# Patient Record
Sex: Female | Born: 1935 | Race: Black or African American | Hispanic: No | Smoking: Never smoker
Health system: Southern US, Community
[De-identification: ages and names within clinical notes are randomized; demographics above are authoritative.]

## PROBLEM LIST (undated history)

## (undated) ENCOUNTER — Emergency Department (HOSPITAL_COMMUNITY): Admission: EM | Payer: Self-pay | Source: Home / Self Care

## (undated) DIAGNOSIS — H409 Unspecified glaucoma: Secondary | ICD-10-CM

## (undated) DIAGNOSIS — I10 Essential (primary) hypertension: Secondary | ICD-10-CM

## (undated) DIAGNOSIS — K219 Gastro-esophageal reflux disease without esophagitis: Secondary | ICD-10-CM

## (undated) DIAGNOSIS — R300 Dysuria: Secondary | ICD-10-CM

## (undated) DIAGNOSIS — R319 Hematuria, unspecified: Secondary | ICD-10-CM

## (undated) DIAGNOSIS — I2510 Atherosclerotic heart disease of native coronary artery without angina pectoris: Secondary | ICD-10-CM

## (undated) DIAGNOSIS — M1990 Unspecified osteoarthritis, unspecified site: Secondary | ICD-10-CM

## (undated) DIAGNOSIS — J449 Chronic obstructive pulmonary disease, unspecified: Secondary | ICD-10-CM

## (undated) DIAGNOSIS — B369 Superficial mycosis, unspecified: Secondary | ICD-10-CM

## (undated) HISTORY — DX: Unspecified osteoarthritis, unspecified site: M19.90

## (undated) HISTORY — DX: Dysuria: R30.0

## (undated) HISTORY — PX: CHOLECYSTECTOMY: SHX55

## (undated) HISTORY — PX: ABDOMINAL HYSTERECTOMY: SHX81

## (undated) HISTORY — DX: Hematuria, unspecified: R31.9

## (undated) HISTORY — DX: Superficial mycosis, unspecified: B36.9

## (undated) HISTORY — PX: APPENDECTOMY: SHX54

---

## 2000-02-27 ENCOUNTER — Encounter (HOSPITAL_BASED_OUTPATIENT_CLINIC_OR_DEPARTMENT_OTHER): Payer: Self-pay | Admitting: Internal Medicine

## 2000-02-27 ENCOUNTER — Inpatient Hospital Stay (HOSPITAL_COMMUNITY): Admission: RE | Admit: 2000-02-27 | Discharge: 2000-02-29 | Payer: Self-pay | Admitting: Internal Medicine

## 2001-02-15 ENCOUNTER — Other Ambulatory Visit: Admission: RE | Admit: 2001-02-15 | Discharge: 2001-02-15 | Payer: Self-pay | Admitting: Obstetrics and Gynecology

## 2001-08-26 ENCOUNTER — Emergency Department (HOSPITAL_COMMUNITY): Admission: EM | Admit: 2001-08-26 | Discharge: 2001-08-26 | Payer: Self-pay | Admitting: Emergency Medicine

## 2001-08-26 ENCOUNTER — Encounter: Payer: Self-pay | Admitting: Emergency Medicine

## 2002-03-08 ENCOUNTER — Encounter: Payer: Self-pay | Admitting: Emergency Medicine

## 2002-03-08 ENCOUNTER — Emergency Department (HOSPITAL_COMMUNITY): Admission: EM | Admit: 2002-03-08 | Discharge: 2002-03-08 | Payer: Self-pay | Admitting: Emergency Medicine

## 2002-03-11 ENCOUNTER — Encounter: Payer: Self-pay | Admitting: Family Medicine

## 2002-03-11 ENCOUNTER — Ambulatory Visit (HOSPITAL_COMMUNITY): Admission: RE | Admit: 2002-03-11 | Discharge: 2002-03-11 | Payer: Self-pay | Admitting: Family Medicine

## 2002-08-04 ENCOUNTER — Encounter: Payer: Self-pay | Admitting: Emergency Medicine

## 2002-08-04 ENCOUNTER — Emergency Department (HOSPITAL_COMMUNITY): Admission: EM | Admit: 2002-08-04 | Discharge: 2002-08-04 | Payer: Self-pay | Admitting: *Deleted

## 2002-11-04 ENCOUNTER — Encounter: Payer: Self-pay | Admitting: Orthopedic Surgery

## 2002-11-04 ENCOUNTER — Ambulatory Visit: Admission: RE | Admit: 2002-11-04 | Discharge: 2002-11-04 | Payer: Self-pay | Admitting: Orthopedic Surgery

## 2003-01-21 ENCOUNTER — Ambulatory Visit (HOSPITAL_COMMUNITY): Admission: RE | Admit: 2003-01-21 | Discharge: 2003-01-21 | Payer: Self-pay | Admitting: Family Medicine

## 2003-02-26 ENCOUNTER — Emergency Department (HOSPITAL_COMMUNITY): Admission: EM | Admit: 2003-02-26 | Discharge: 2003-02-26 | Payer: Self-pay | Admitting: Emergency Medicine

## 2003-10-01 ENCOUNTER — Emergency Department (HOSPITAL_COMMUNITY): Admission: EM | Admit: 2003-10-01 | Discharge: 2003-10-01 | Payer: Self-pay | Admitting: Emergency Medicine

## 2003-11-20 ENCOUNTER — Inpatient Hospital Stay (HOSPITAL_COMMUNITY): Admission: EM | Admit: 2003-11-20 | Discharge: 2003-11-22 | Payer: Self-pay | Admitting: Emergency Medicine

## 2004-02-02 ENCOUNTER — Encounter (HOSPITAL_COMMUNITY): Admission: RE | Admit: 2004-02-02 | Discharge: 2004-03-03 | Payer: Self-pay | Admitting: Family Medicine

## 2004-06-13 ENCOUNTER — Ambulatory Visit (HOSPITAL_COMMUNITY): Admission: RE | Admit: 2004-06-13 | Discharge: 2004-06-13 | Payer: Self-pay | Admitting: Family Medicine

## 2004-10-20 ENCOUNTER — Emergency Department (HOSPITAL_COMMUNITY): Admission: EM | Admit: 2004-10-20 | Discharge: 2004-10-20 | Payer: Self-pay | Admitting: Emergency Medicine

## 2004-10-24 ENCOUNTER — Ambulatory Visit (HOSPITAL_COMMUNITY): Admission: RE | Admit: 2004-10-24 | Discharge: 2004-10-24 | Payer: Self-pay | Admitting: Family Medicine

## 2005-04-23 ENCOUNTER — Emergency Department (HOSPITAL_COMMUNITY): Admission: EM | Admit: 2005-04-23 | Discharge: 2005-04-23 | Payer: Self-pay | Admitting: Emergency Medicine

## 2005-11-23 ENCOUNTER — Encounter (INDEPENDENT_AMBULATORY_CARE_PROVIDER_SITE_OTHER): Payer: Self-pay | Admitting: Specialist

## 2005-11-23 ENCOUNTER — Ambulatory Visit (HOSPITAL_COMMUNITY): Admission: RE | Admit: 2005-11-23 | Discharge: 2005-11-23 | Payer: Self-pay | Admitting: Gastroenterology

## 2005-11-23 ENCOUNTER — Ambulatory Visit: Payer: Self-pay | Admitting: Gastroenterology

## 2005-12-26 ENCOUNTER — Ambulatory Visit (HOSPITAL_COMMUNITY): Admission: RE | Admit: 2005-12-26 | Discharge: 2005-12-26 | Payer: Self-pay | Admitting: Family Medicine

## 2006-02-14 IMAGING — CR DG CHEST 1V PORT
1 series · 1 of 1 positions shown · non-contrast
Comparison: Two view chest x-ray 10/01/03.

CLINICAL DATA: Chest pain.  
 PORTABLE CHEST ONE VIEW 11/20/03

[view not recorded]
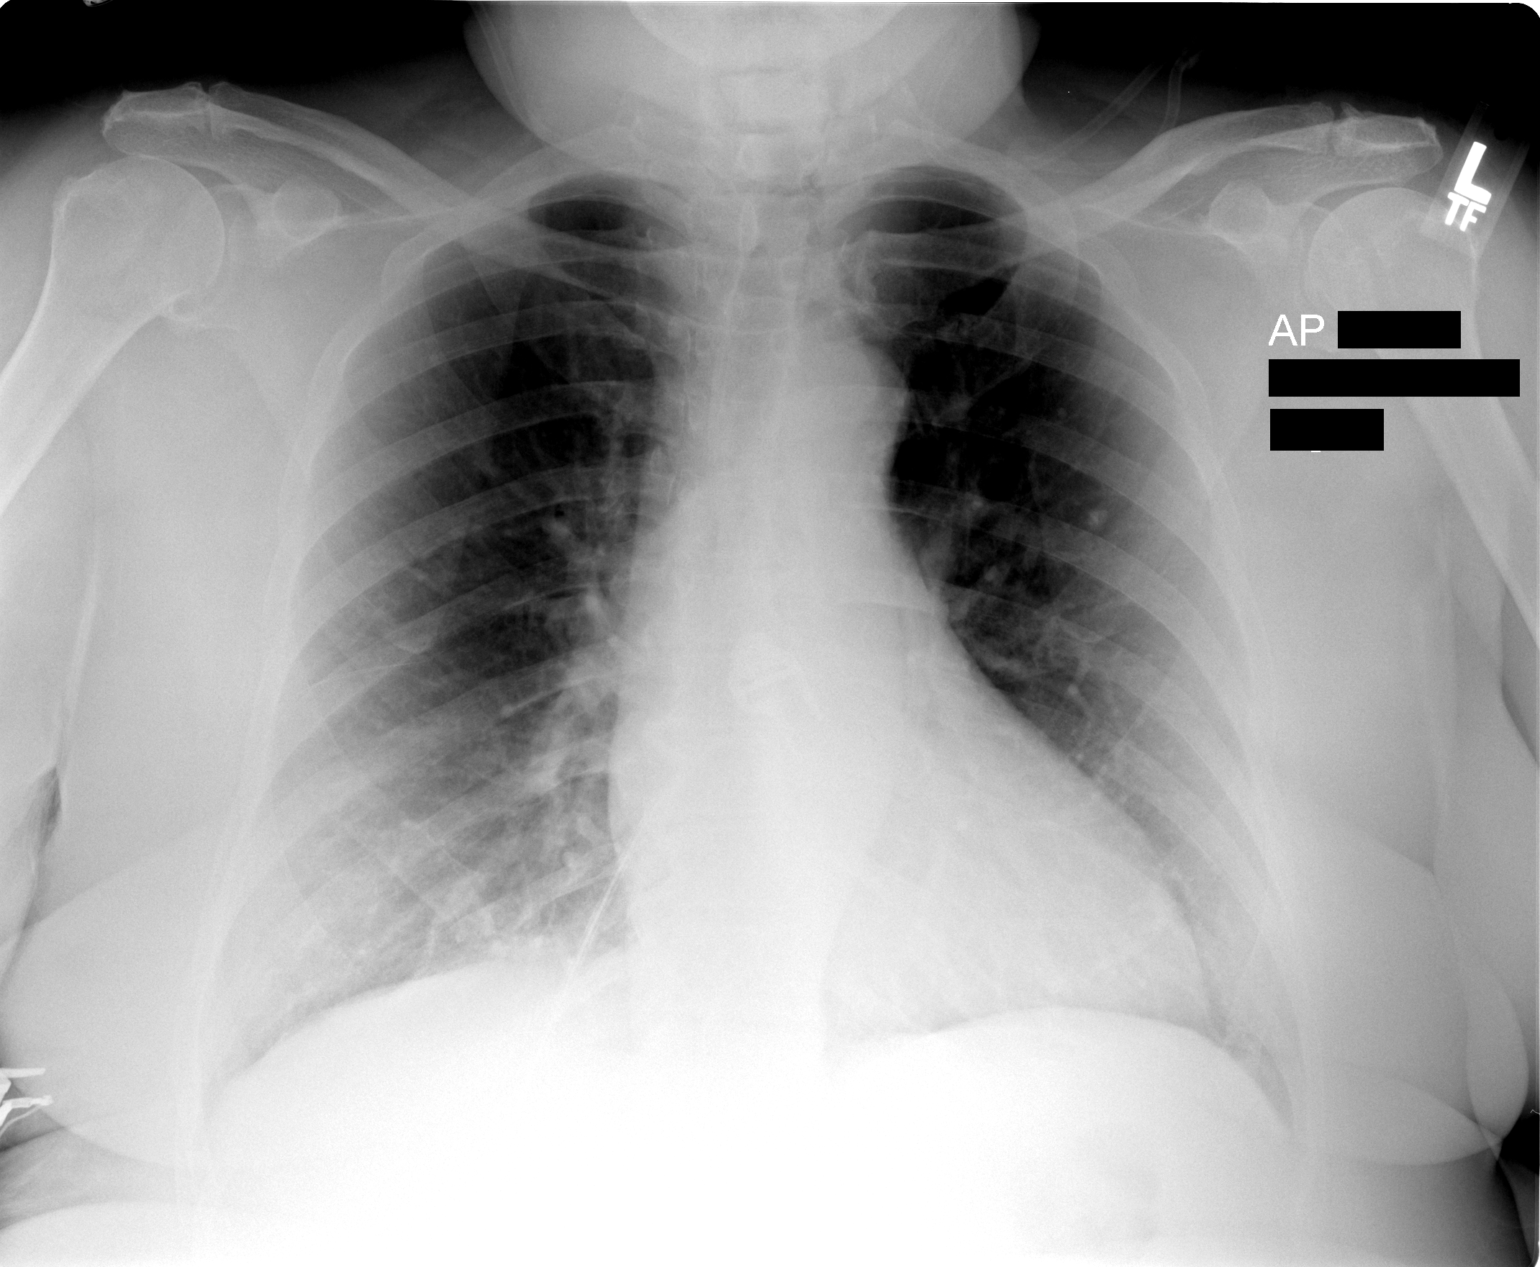

[1 of 1 positions shown; findings below may reference images not displayed]

FINDINGS: The heart size is normal and is stable.  The thoracic aorta is mildly tortuous but unchanged.  The lungs appear clear.  
 IMPRESSION
 No evidence of acute disease.

## 2006-03-09 ENCOUNTER — Emergency Department (HOSPITAL_COMMUNITY): Admission: EM | Admit: 2006-03-09 | Discharge: 2006-03-09 | Payer: Self-pay | Admitting: Emergency Medicine

## 2007-01-18 ENCOUNTER — Ambulatory Visit (HOSPITAL_COMMUNITY): Admission: RE | Admit: 2007-01-18 | Discharge: 2007-01-18 | Payer: Self-pay | Admitting: Internal Medicine

## 2007-06-19 ENCOUNTER — Ambulatory Visit (HOSPITAL_COMMUNITY): Admission: RE | Admit: 2007-06-19 | Discharge: 2007-06-19 | Payer: Self-pay | Admitting: Internal Medicine

## 2007-08-16 ENCOUNTER — Emergency Department (HOSPITAL_COMMUNITY): Admission: EM | Admit: 2007-08-16 | Discharge: 2007-08-16 | Payer: Self-pay | Admitting: Emergency Medicine

## 2007-10-19 ENCOUNTER — Emergency Department (HOSPITAL_COMMUNITY): Admission: EM | Admit: 2007-10-19 | Discharge: 2007-10-19 | Payer: Self-pay | Admitting: Emergency Medicine

## 2007-11-27 ENCOUNTER — Encounter (INDEPENDENT_AMBULATORY_CARE_PROVIDER_SITE_OTHER): Payer: Self-pay | Admitting: Internal Medicine

## 2007-11-27 ENCOUNTER — Ambulatory Visit: Payer: Self-pay | Admitting: Cardiology

## 2007-11-27 ENCOUNTER — Ambulatory Visit (HOSPITAL_COMMUNITY): Admission: RE | Admit: 2007-11-27 | Discharge: 2007-11-27 | Payer: Self-pay | Admitting: Internal Medicine

## 2007-12-13 ENCOUNTER — Ambulatory Visit: Payer: Self-pay | Admitting: Cardiology

## 2008-08-06 ENCOUNTER — Emergency Department (HOSPITAL_COMMUNITY): Admission: EM | Admit: 2008-08-06 | Discharge: 2008-08-06 | Payer: Self-pay | Admitting: Emergency Medicine

## 2008-09-05 ENCOUNTER — Emergency Department (HOSPITAL_COMMUNITY): Admission: EM | Admit: 2008-09-05 | Discharge: 2008-09-05 | Payer: Self-pay | Admitting: Emergency Medicine

## 2009-07-21 ENCOUNTER — Emergency Department (HOSPITAL_COMMUNITY): Admission: EM | Admit: 2009-07-21 | Discharge: 2009-07-21 | Payer: Self-pay | Admitting: Emergency Medicine

## 2010-03-05 ENCOUNTER — Emergency Department (HOSPITAL_COMMUNITY)
Admission: EM | Admit: 2010-03-05 | Discharge: 2010-03-05 | Payer: Self-pay | Source: Home / Self Care | Admitting: Emergency Medicine

## 2010-03-20 ENCOUNTER — Encounter: Payer: Self-pay | Admitting: Family Medicine

## 2010-07-12 NOTE — Assessment & Plan Note (Signed)
[facility]                       [facility] OFFICE NOTE   NAME:Solis, Roberta D                      MRN:          [id number]  DATE:12/13/2007                            DOB:          1931/10/06    REFERRING PHYSICIAN:  Tesfaye D. Felecia Shelling, MD   REASON FOR VISIT:  Abnormal echocardiogram and lower extremity edema.   HISTORY OF PRESENT ILLNESS:  [name] is a very pleasant 76-year-  old woman with a history of type 2 diabetes mellitus with some degree of  reported neuropathy, hypertension, obesity, and previous reassuring  cardiac catheterization back in 2002 demonstrating minor atherosclerosis  with no flow-limiting lesions.  She reports a 62-month history of lower  extremity edema, right somewhat worse than left without any obvious  inciting injury.  She has not had any significant change in her baseline  breathing status nor any chest discomfort associated with this.  She  reports no PND or orthopnea.  She describes this edema is being fairly  dependent, worse at the end of the today when she has been standing, and  gone in the morning when she gets up.  She uses Lasix intermittently and  also lower extremity compression hose intermittently with some  improvement.  She reports that she has had approximately 14-pound weight  loss with focus on diet over the last 3 weeks and is very pleased with  this.  In fact, her symptoms are improving with this intervention.  Her  electrocardiogram shows sinus rhythm at 77 beats per minute with  premature atrial complexes, left ventricle hypertrophy, and left atrial  enlargement.  She had lower extremity arterial studies done back in  April which demonstrated no obvious obstructive disease and more  recently an echocardiogram done in September demonstrating vigorous left  ventricular systolic function at 65-70% without wall motion  abnormalities and with mild diastolic dysfunction.  She had moderate  left ventricular hypertrophy noted associated with mild-to-moderate left  atrial enlargement and trace mitral regurgitation.  Incidentally noted  was borderline aneurysmal motion of the inner atrial septum with  inability to completely exclude a patent foramen ovale, although no  obvious large degree of shunting was noted.  She comes into the office  to discuss this as well.   ALLERGIES:  No known drug allergies.   Present medications include:  1. Glyburide 3 mg p.o. b.i.d.  2. Gabapentin 300 mg p.o. nightly  3. Lisinopril 20 mg p.o. daily.  4. Verapamil 120 mg p.o. daily.  5. Lasix p.r.n. (not clear on dose.)   PAST MEDICAL HISTORY:  As outlined above.  She has reflux and uses an  over-the-counter antacid.  Also, reports previous hysterectomy and  cholecystectomy.   SOCIAL HISTORY:  She is retired, has 2 sons.  She does exercise using a  stationary bicycle in the morning without any particular symptoms.  Denies any active tobacco use, although did smoke approximately 30 years  ago.  No alcohol use.   Family history was reviewed, significant for massive heart attack in  one of her sisters as well as stroke and diabetes mellitus.  Her mother  notably lived to 73 years of age.   REVIEW OF SYSTEMS:  As outlined above.  She feels as if her breathing is  actually fairly good describing NYHA class II symptoms.  She is not  having any orthopnea, PND, or palpitations.  She has problems with  osteoarthritis and does ambulate with a cane.  No recent falls.  Otherwise, negative.   PHYSICAL EXAMINATION:  VITAL SIGNS:  Blood pressure is 124/71, heart  rate is 76, weight is 247 pounds, height is 5 feet 3 inches.  GENERAL:  This is an obese woman no acute distress.  HEENT:  Conjunctiva is normal.  Pharynx clear.  NECK:  Supple.  No elevated jugular venous pressure is obvious with  increased neck girth.  No thyromegaly.  LUNGS:  Exhibit diminished breath sounds throughout.  CARDIAC:  A  regular rate and rhythm.  No pathologic murmur or S3 gallop.  Second heart sound is normal.  ABDOMEN:  Obese.  EXTREMITIES:  Exhibit increased adipose tissue throughout.  There is  edema noted that is essentially symmetric below the knees bilaterally  and 1+ pitting.  Distal pulses are 1+.  SKIN:  Warm and dry.  MUSCULOSKELETAL:  No kyphosis noted.  NEUROPSYCHIATRIC:  The patient is alert x3.  Affect is normal.   IMPRESSION AND RECOMMENDATIONS:  1. Lower extremity edema, likely complicated by obesity, and some      degree of venous insufficiency as well as effects of neuropathy      overtime.  Her left ventricular systolic function is vigorous, and      she does have some diastolic dysfunction associated with moderate      left ventricular hypertrophy, likely a reflection of hypertension      over the years.  She had no clear description of increased right-      sided pressures with only trivial tricuspid regurgitation and      overall normal right ventricular systolic function.  I think that      her present management with diuretic therapy and compression hose      is very reasonable and doubt that further cardiac workup is      necessary at this time.  She has had lower extremity arterial      studies done earlier in the year.  If her edema becomes truly      asymmetric or progresses, one could consider venous Dopplers to      exclude thromboembolic disease, but this seems unlikely as well at      this point.  2. Incidentally noted aneurysmal motion of the interatrial septum with      inability to completely exclude a patent foramen ovale, although no      large shunt was described.  I suspect that this is clinically      noncontributory at this point.  If further investigation is wanted,      would suggest an echocardiogram with agitated saline injection for      better visualization of this area.  She does not require a      transesophageal echocardiogram at this point.  3. In  terms of cardiac disposition, we will see her back as needed,      particularly if the symptoms progress.  Otherwise, regular follow      up will be with Dr. Felecia Shelling.     Jonelle Sidle, MD  Electronically Signed    SGM/MedQ  DD: 12/13/2007  DT: 12/14/2007  Job #: [id number]  cc:   Tesfaye D. Felecia Shelling, MD

## 2010-07-15 NOTE — Cardiovascular Report (Signed)
[name]  Patient:    [name], [name]                        MRN: [id number] Proc. Date: 02/29/00 Adm. Date:  [id number] Attending:  Lewayne Bunting CC:         Loran Senters, M.D.  Lewayne Bunting, M.D.  [facility]   Cardiac Catheterization  PROCEDURE PERFORMED:  Left heart catheterization with coronary angiography and left ventriculography.  INDICATION:  Ms. Hinojosa is a 77 year old woman with cardiovascular risk factors.  She has been having progressive exertional chest pain and was referred for cardiac catheterization.  PROCEDURAL NOTE:  A 6-French sheath was placed in the right femoral artery. Standard Judkins 6-French catheters were utilized.  Contrast was Omnipaque. There were no complications.  RESULTS Hemodynamics:  Left ventricular pressure 164/20.  Aortic pressure 156/80. There was no aortic valve gradient.  Left ventriculogram:  Wall motion is normal, ejection fraction estimated at greater than 60%.  No  mitral regurgitation.  Coronary arteriography (right dominant): 1. Left main is normal. 2. Left anterior descending has a 30% stenosis in the mid-vessel just after    the origin of the first diagonal.  In the mid-to-distal vessel between the    second and third diagonal branches is a 30% stenosis.  The LAD gives rise    to a large first diagonal, a small second diagonal and small third    diagonal.  There appears to be a small A-V fistula connecting the third    diagonal to a cardiac vein. 3. Left circumflex has a 20% stenosis in the distal vessel after OM-2.  The    circumflex gives rise to a small OM-1, normal-sized OM-2 and a small OM-3. 4. Right coronary artery has a less than 20% stenosis in the proximal vessel.    The distal right coronary artery gives rise to a normal-sized posterior    descending artery and two small posterolateral branches.  IMPRESSIONS 1. Normal left ventricular systolic  function. 2. Insignificant coronary artery disease. DD:  02/29/00 TD:  02/29/00 Job: [id number]

## 2010-07-15 NOTE — Op Note (Signed)
NAMEAnett, [name]               ACCOUNT NO.:  [id number]   MEDICAL RECORD NO.:  [id number]          PATIENT TYPE:  AMB   LOCATION:  DAY                           FACILITY:  [facility]   PHYSICIAN:  Kassie Mends, M.D.      DATE OF BIRTH:  23-Dec-1931   DATE OF PROCEDURE:  11/23/2005  DATE OF DISCHARGE:                                 OPERATIVE REPORT   PROCEDURE:  Colonoscopy with cold forceps polypectomy.   INDICATIONS FOR EXAM:  [name] is a 77 year old female who presents  for colon cancer screening.   FINDINGS:  1. Three 3 mm cecal polyps removed via cold forceps with biopsies.  2. No masses, inflammatory changes, or vascular ectasias seen.  No      diverticula evident.  3. Normal retroflexed view of the rectum.   RECOMMENDATIONS:  1. A high-fiber diet.  Handout given on high-fiber diet and polyps.  2. Office will call with biopsy report.  If polyps adenomatous, then next      screening colonoscopy should be in five years, and children should      begin screening at age 17.  3. Follow up with Dr. Lubertha South.   MEDICATIONS:  1. Demerol 75 mg IV.  2. Versed 5 mg IV.   PROCEDURAL TECHNIQUE:  Physical exam was performed, and informed consent was  obtained from the patient after explaining the risks, benefits and  alternatives of the procedure, which the patient appeared to understand, as  stated.  Patient was connected to the monitor and placed in the left lateral  position.  Continuous oxygen was provided by nasal cannula, and IV medicines  were administered through an indwelling cannula.  After rectal exam  and administration of sedation, the scope was advanced under direct  visualization to the cecum.  The scope was slowly removed by carefully  examining the anatomy integrity and texture of the mucosa on the way out.  The patient was recovered in the endoscopy suite and discharged to home in  satisfactory condition.      Kassie Mends, M.D.  Electronically  Signed     SM/MEDQ  D:  11/23/2005  T:  11/24/2005  Job:  [id number]   cc:   Donna Bernard, M.D.  Fax: [phone]

## 2010-07-15 NOTE — Discharge Summary (Signed)
. [facility]  Patient:    [name], [name]                        MRN: [id number] Adm. Date:  [id number] Disc. Date: 02/29/00 Attending:  Lewayne Bunting Dictator:   Tereso Newcomer, P.A.-C. CC:         Dr. Gerda Diss - Phone [phone]   Discharge Summary  DATE OF BIRTH:  [birth year]  DISCHARGE DIAGNOSES: 1. Insignificant coronary artery disease. 2. Diabetes mellitus type 2. 3. Hypertension. 4. Degenerative joint disease. 5. Degenerative disk disease. 6. Status post hysterectomy. 7. Status post cholecystectomy. 8. Anxiety. 9. Urinary tract infection.  PROCEDURES:  Cardiac catheterization on February 29, 2000, by Dr. Loraine Leriche Pulsipher, revealing a left ventriculogram, normal wall motion, an ejection fraction of greater than 60%, no mitral regurgitation.  Coronaries:  Left main normal.  Left anterior descending coronary artery mid-30%, mid to distal 30%, diagonal-III small with questionable small arteriovenous fistula to cardiac vein.  Left circumflex distal 20%.  Right coronary artery proximal less than 20%.  HISTORY OF PRESENT ILLNESS:  This 77 year old female with a history of diabetes mellitus, hypertension, and no known coronary artery disease, was transferred from [facility] for the evaluation of chest pain on February 27, 2000.  She had had a stress Cardiolyte done in 1999, that was read as normal, but there was some breast attenuation that was also noted on the report.  Over the last few weeks prior to admission she had experienced substernal chest tightness with exertion.   She had associated shortness of breath and radiating pain to her jaw and left arm.  Her exertional chest discomfort seemed worse over the last few weeks.  Two days prior to admission she started having left-sided sharp fleeting chest pain, also with associated shortness of breath and radiation to her neck and left arm.  She denied any nausea but had some  diaphoresis.  Her pain was a 4-5/10 at its worst.  Her enzymes were negative at [facility], and her electrocardiogram was without acute change.  She was transferred for further evaluation.  ALLERGIES:  No known drug allergies.  PHYSICAL EXAMINATION:  GENERAL:  A well-developed, well-nourished female, in no acute distress.  VITAL SIGNS:  Blood pressure 122/62, pulse 68, respirations 20, temperature 98 degrees.  NECK:  Without jugular venous distention or bruits.  CARDIAC:  S1, S2, a regular rate and rhythm.  No murmurs, clicks, rubs, or gallops.  LUNGS:  Clear to auscultation bilaterally.  ABDOMEN:  Positive bowel sounds, soft, nontender, nondistended.  No hepatosplenomegaly.  EXTREMITIES:  A trace of edema bilaterally.  Electrocardiogram:  Normal sinus rhythm, heart rate 80, nonspecific ST-T wave changes, occasional PVCs.  LABORATORY DATA:  Hemoglobin 13.9, hematocrit 42.1, white count 9500, platelet count 257,000.  Sodium 141, potassium 3.7, chloride 102, CO2 of 29, BUN 16, creatinine 0.8, glucose 220.  TSH 0.55.  Urinalysis:  Large blood positive nitrites, 16-30 rbcs, 2-5 wbcs, 1+ bacteria.  HOSPITAL COURSE:  The patient was accepted in transfer to [facility].  A lipid profile revealed a total cholesterol of 194, triglycerides 130, HDL 61, LDL 107.  She was continued on Lovenox and a beta blocker and nitroglycerin drip through the holiday.  She had no further chest pain and went for the cardiac catheterization on February 29, 2000, by Dr. Gerri Spore. The results are noted above.  Given the results of her cardiac catheterization, it was  felt that she was stable enough for discharge to home, after her post-catheterization rest period was complete, as long as she was stable.  DISCHARGE MEDICATIONS:  She will continue on the Cipro for the urinary tract infection diagnosis at [facility].  She would continue on her regular home medications as  follows: 1. Glynase 3 mg q.d. 2. Dyazide 37.5 mg/25 mg q.d. 3. Xanax 0.5 mg t.i.d. p.r.n. 4. Verapamil 120 mg q.d. 5. Cipro 250 mg b.i.d. x 3 more days.  FOLLOWUP:  She will follow up with Dr. Gerda Diss in one to two weeks for further workup of her chest pain.  She is to call for an appointment.  ACTIVITIES:  No driving, heavy lifting, or exertion for three days.  DIET:  A low-fat, low-sodium, diabetic diet.  WOUND CARE:  The patient is to call our office in  with any concerns of her groin swelling, bleeding, or bruising. DD:  02/29/00 TD:  02/29/00 Job: [id number]

## 2010-07-15 NOTE — H&P (Signed)
[name], [name]               ACCOUNT NO.:  [id number]   MEDICAL RECORD NO.:  [id number]          PATIENT TYPE:  INP   LOCATION:  A226                          FACILITY:  [facility]   PHYSICIAN:  Donna Bernard, M.D.DATE OF BIRTH:  [birth year]   DATE OF ADMISSION:  11/20/2003  DATE OF DISCHARGE:  LH                                HISTORY & PHYSICAL   CHIEF COMPLAINT:  Chest pain.   SUBJECTIVE:  This patient is a 77 year old black female with history of type  2 diabetes, hypertension, hyperlipidemia, anxiety, peripheral neuropathy,  who presents to the emergency room on the night of admission with complaints  of chest pain.  Further history notes that chest pain is at times dull and  aching and at other times sharp.  It is worse with certain motions.  She  also has pain when she moves her left shoulder.  In addition, she has  achiness going down the left arm.  She has no nausea, no diaphoresis, no  shortness of breath out of the ordinary.  The discomfort did make the  patient somewhat anxious as far as her heart and reason that she came into  the hospital.   The ER doctor saw her, gave her nitroglycerin.  She had subjective  improvement.  Based on this, he recommended hospitalization.  The patient  claims compliance with her current medications.   CURRENT MEDICATIONS:  1.  Verapamil 120 SR daily.  2.  Glynase 2 mg p.o. b.i.d.  3.  Diazide p.r.n.  4.  Xanax 0.5 p.r.n.  5.  Enteric-coated aspirin 325 mg 1 daily.  6.  Zantac p.r.n.  7.  Zyrtec 10 mg h.s. p.r.n.   PAST MEDICAL HISTORY:  Significant for chronic problems as noted above.  Also of note, the patient's diabetes recently has been suboptimal with A1C  of 8% despite medications.  The patient could not tolerate ACTOS or  GLUCOPHAGE.  The patient has been resistant to insulin recommendations.   PAST SURGICAL HISTORY:  1.  Remote hysterectomy.  2.  Remote cholecystectomy.   PRIOR PROCEDURES:  Cardiac catheterization in  2002 which showed mild disease  of 30% in multiple vessels.   FAMILY HISTORY:  Significant for hypertension, diabetes, coronary artery  disease, lung cancer.   ALLERGIES:  No true allergies but sensitive to GLUCOPHAGE, ACTOS, FLU SHOTS.   SOCIAL HISTORY:  The patient is retired, widowed.  No tobacco use.  No  alcohol use.  Two children.   REVIEW OF SYSTEMS:  Otherwise negative.   PHYSICAL EXAMINATION:  VITAL SIGNS:  Normotensive 128/78, febrile.  GENERAL:  Alert.  No acute distress when not moving about.  HEENT:  TMs normal.  Disks sharp.  Oropharynx normal.  NECK:  Supple.  MUSCULOSKELETAL:  There is anterolateral tenderness in the  sternocleidomastoid muscle in the left cervical region.  In addition, the  patient has distinct tenderness on the superior part of the chest wall, and  she has pain when rotating the left shoulder.  Distal arm strength good.  Pulses good.  Legs:  Pulses good, no edema.  Sensation:  Question slightly  diminished in toes  ABDOMEN:  Benign, nontender.  LUNGS:  Clear.  HEART:  Slight flow murmur noted.   LABORATORY AND X-RAY DATA:  Significant labs:  Initial cardiac enzymes  normal.   EKG unremarkable.   D-dimer normal.   Please see labs.   IMPRESSION:  1.  Chest pain.  Based on response to nitroglycerin, the emergency room      doctor was concerned about heart disease.  However, on further history      and exam, I feel the pain is noncardiac; however, will take appropriate      precautions, monitor her with telemetry and cardiac enzymes, other tests      as noted.  2.  Type 2 diabetes with recent suboptimal control.  3.  Hypertension, good control.  4.  Hyperlipidemia with patient working on this with her diet recently. We      added Mevacor q.h.s.   PLAN:  As per orders.      WSL/MEDQ  D:  11/22/2003  T:  11/22/2003  Job:  [id number]

## 2010-07-15 NOTE — Discharge Summary (Signed)
[place]  Patient:    [name], [name]                        MRN: [id number] Adm. Date:  [id number] Attending:  Lewayne Bunting Dictator:   Tereso Newcomer, P.A.                           Discharge Summary  ADDENDUM (to dictation [phone])  Upon discharging the patient, she still noted some chest discomfort.  She related it to indigestion in the past.  I have given her a prescription for Zantac 150 mg b.i.d., #30 with no refill, to be taken until she sees Dr. Gerda Diss in followup. DD:  02/29/00 TD:  02/29/00 Job: [id number]

## 2010-07-15 NOTE — Procedures (Signed)
NAMELAELIA, ANGELO               ACCOUNT NO.:  [id number]   MEDICAL RECORD NO.:  [id number]           PATIENT TYPE:   LOCATION:                                 FACILITY:   PHYSICIAN:  Donna Bernard, M.D.     DATE OF BIRTH:   DATE OF PROCEDURE:  11/21/2003  DATE OF DISCHARGE:                                EKG INTERPRETATION   EKG reveals normal sinus rhythm with first-degree AV block.  There is  increased voltage in the anterolateral leads suggestive of left ventricular  hypertrophy.  No other significant changes noted.       ___________________________________________  Lacretia Nicks. Simone Curia, M.D.    Karie Chimera  D:  12/04/2003  T:  12/04/2003  Job:  [id number]

## 2010-07-15 NOTE — Discharge Summary (Signed)
NAMELAELA, DEVINEY               ACCOUNT NO.:  [id number]   MEDICAL RECORD NO.:  [id number]          PATIENT TYPE:  INP   LOCATION:  A226                          FACILITY:  [facility]   PHYSICIAN:  Donna Bernard, M.D.DATE OF BIRTH:  23-Feb-1932   DATE OF ADMISSION:  11/20/2003  DATE OF DISCHARGE:  09/25/2005LH                                 DISCHARGE SUMMARY   FINAL DIAGNOSES:  1.  Chest pain secondary to musculoskeletal etiology.  2.  Left-arm pain secondary to musculoskeletal etiology.  3.  Type 2 diabetes.  4.  Hypertension.  5.  Hyperlipidemia.  6.  Peripheral neuropathy.   FINAL DISPOSITION:  The patient is discharged to home.   DISCHARGE MEDICATIONS:  1.  Maintain usual medicines plus Tramadol one up to four times a day as      needed for pain.  2.  Lodine one twice per day with food.   DISCHARGE INSTRUCTIONS:  1.  Next scheduled appointment later this week for a recheck.  2.  Usual diabetic, low-salt diet, and low-cholesterol diet.   INITIAL HISTORY AND PHYSICAL:  Please see history and physical as dictated.   HOSPITAL COURSE:  This patient today is a 77 year old black female with a  history of multiple medical problems including type 2 diabetes,  hypertension, hyperlipidemia who presented to the emergency room the night  of admission with complaints of left-sided chest pain.  The ER doctor gave  her nitroglycerin which seemed to resolve her symptoms.  Based on this and  her multiple risk factors, the patient was admitted to the hospital.   Her EKGs were unremarkable.  Initial set of cardiac enzymes were  unremarkable.   PHYSICAL EXAMINATION:  On exam yesterday morning, the patient had  significant tenderness and soreness to palpation in the left upper chest  wall region.  In addition, radiation to the left arm created significant  pain that radiated on down the arm.  The patient's neurological exam was  stable.  Lungs were clear.  Heart, regular rate and rhythm.   In addition,  the pain was sharp with a deep breath.  Based on all these factors, I felt  the pain was noncardiac.   The patient had a cardiac catheterization in 2002 which showed mild disease.  We went ahead and monitored her another 24 hours.  Serial cardiac enzymes  have been normal.  Repeat electrocardiogram showed stable results with  normal sinus rhythm and no significant ST-T changes.  Today, the date of  discharge, the patient is feeling somewhat  better, but she is still hurting.  However, the pain is worse with certain  motions and sharp in nature and in a similar distribution as yesterday.  Based on this, I feel the patient does not need to remain in the hospital.  The patient is discharged home with diagnosis and disposition as noted  above.      WSL/MEDQ  D:  11/22/2003  T:  11/22/2003  Job:  [id number]

## 2010-10-30 ENCOUNTER — Emergency Department (HOSPITAL_COMMUNITY): Payer: Medicare Other

## 2010-10-30 ENCOUNTER — Encounter: Payer: Self-pay | Admitting: *Deleted

## 2010-10-30 ENCOUNTER — Emergency Department (HOSPITAL_COMMUNITY)
Admission: EM | Admit: 2010-10-30 | Discharge: 2010-10-30 | Disposition: A | Discharge status: Home / Self Care | Payer: Medicare Other | Attending: Emergency Medicine | Admitting: Emergency Medicine

## 2010-10-30 DIAGNOSIS — M545 Low back pain, unspecified: Secondary | ICD-10-CM

## 2010-10-30 DIAGNOSIS — I10 Essential (primary) hypertension: Secondary | ICD-10-CM | POA: Insufficient documentation

## 2010-10-30 DIAGNOSIS — N390 Urinary tract infection, site not specified: Secondary | ICD-10-CM

## 2010-10-30 DIAGNOSIS — I2510 Atherosclerotic heart disease of native coronary artery without angina pectoris: Secondary | ICD-10-CM | POA: Insufficient documentation

## 2010-10-30 DIAGNOSIS — E119 Type 2 diabetes mellitus without complications: Secondary | ICD-10-CM | POA: Insufficient documentation

## 2010-10-30 HISTORY — DX: Atherosclerotic heart disease of native coronary artery without angina pectoris: I25.10

## 2010-10-30 HISTORY — DX: Essential (primary) hypertension: I10

## 2010-10-30 LAB — URINALYSIS, ROUTINE W REFLEX MICROSCOPIC
Nitrite: POSITIVE — AB
Specific Gravity, Urine: 1.025 (ref 1.005–1.030)
Urobilinogen, UA: 0.2 mg/dL (ref 0.0–1.0)

## 2010-10-30 LAB — URINE MICROSCOPIC-ADD ON

## 2010-10-30 MED ORDER — CEPHALEXIN 500 MG PO CAPS
500.0000 mg | ORAL_CAPSULE | Freq: Once | ORAL | Status: AC
  Administered 2010-10-30: 500 mg via ORAL
  Filled 2010-10-30: qty 1

## 2010-10-30 MED ORDER — CEPHALEXIN 500 MG PO CAPS: 500.0000 mg | ORAL_CAPSULE | Freq: Four times a day (QID) | ORAL | Status: AC

## 2010-10-30 NOTE — ED Notes (Signed)
C/o right mid to low back pain onset one week ago--no recent injury but states she did fall while trying to sit in chair about one month ago-landing in floor on buttocks--unsure if there could be any association.  No observable deformity--no shortening or rotation--sitting in wheelchair

## 2010-10-30 NOTE — ED Notes (Signed)
Pt c/o lower back pain radiating to her right side x 3 days. Denies nausea, vomiting, diarrhea or urinary symptoms.

## 2010-10-30 NOTE — ED Provider Notes (Signed)
History     CSN: [id number] Arrival date & time: 10/30/2010 12:08 PM  Chief Complaint  Patient presents with  . Back Pain   HPI Pt was seen at 1320.  Per pt, c/o gradual onset and persistence of constant right sided low back "pain" x1 week.  Pt states the pain began after she "rolled over in bed" one morning.  Pain worsens with palpation of the area, movement of her torso and certain body positions.  States pain occas radiates into her right hip area.  States she slipped and fell onto her buttocks approx 1 month ago, but no recent injury since.  Denies dysuria/hematuria, no flank pain, no fevers, no abd pain, no N/V/D, no CP/SOB, no rash, no saddle anesthesia, no incont/retention of bowel or bladder, no focal motor weakness, no tingling/numbness in extremities.       Past Medical History  Diagnosis Date  . Hypertension   . Diabetes mellitus   . Coronary artery disease     Past Surgical History  Procedure Date  . Cholecystectomy   . Abdominal hysterectomy     History  Substance Use Topics  . Smoking status: Never Smoker   . Smokeless tobacco: Not on file  . Alcohol Use: No    Review of Systems ROS: Statement: All systems negative except as marked or noted in the HPI; Constitutional: Negative for fever and chills. ; ; Eyes: Negative for eye pain, redness and discharge. ; ; ENMT: Negative for ear pain, hoarseness, nasal congestion, sinus pressure and sore throat. ; ; Cardiovascular: Negative for chest pain, palpitations, diaphoresis, dyspnea and peripheral edema. ; ; Respiratory: Negative for cough, wheezing and stridor. ; ; Gastrointestinal: Negative for nausea, vomiting, diarrhea and abdominal pain, blood in stool, hematemesis, jaundice and rectal bleeding. . ; ; Genitourinary: Negative for dysuria, flank pain and hematuria. ; ; Musculoskeletal: +right sided LBP.  Negative for neck pain. Negative for swelling and recent trauma.; ; Skin: Negative for pruritus, rash, abrasions, blisters,  bruising and skin lesion.; ; Neuro: Negative for headache, lightheadedness and neck stiffness. Negative for weakness, altered level of consciousness , altered mental status, extremity weakness, paresthesias, involuntary movement, seizure and syncope.     Physical Exam  BP 130/58  Pulse 86  Temp(Src) 98 F (36.7 C) (Oral)  Resp 20  Ht 5\' 3"  (1.6 m)  Wt 192 lb (87.091 kg)  BMI 34.01 kg/m2  SpO2 98%  Physical Exam 1325: Physical examination:  Nursing notes reviewed; Vital signs and O2 SAT reviewed;  Constitutional: Well developed, Well nourished, Well hydrated, In no acute distress; Head:  Normocephalic, atraumatic; Eyes: EOMI, PERRL, No scleral icterus; ENMT: Mouth and pharynx normal, Mucous membranes moist; Neck: Supple, Full range of motion, No lymphadenopathy; Cardiovascular: Regular rate and rhythm, No murmur, rub, or gallop; Respiratory: Breath sounds clear & equal bilaterally, No rales, rhonchi, wheezes, or rub, Normal respiratory effort/excursion; Chest: Nontender, Movement normal; Abdomen: Soft, Nontender, Nondistended, Normal bowel sounds; Genitourinary: No CVA tenderness; Extremities: Pulses normal, No tenderness, 2+ pedal edema bilat without calf asymmetry. Pelvis stable.  NT right hip.; Neuro: AA&Ox3, Major CN grossly intact.  Speech clear, no facial droop.  No gross focal motor or sensory deficits in extremities.  Strength 5/5 equal bilat UE's and LE's, including great toe dorsiflexion.  DTR 2/4 equal bilat UE's and LE's.  No gross sensory deficits.  Neg straight leg raises bilat.; Skin: Color normal, Warm, Dry; Spine:  No midline CS, TS, LS tenderness.  +TTP right lumbar paraspinal muscles  and SI joint areas.    ED Course  Procedures  MDM MDM Reviewed: nursing note and vitals Interpretation: labs and x-ray   Results for orders placed during the hospital encounter of 10/30/10  URINALYSIS, ROUTINE W REFLEX MICROSCOPIC      Component Value Range   Color, Urine YELLOW  YELLOW      Appearance CLOUDY (*) CLEAR    Specific Gravity, Urine 1.025  1.005 - 1.030    pH 6.0  5.0 - 8.0    Glucose, UA NEGATIVE  NEGATIVE (mg/dL)   Hgb urine dipstick SMALL (*) NEGATIVE    Bilirubin Urine NEGATIVE  NEGATIVE    Ketones, ur NEGATIVE  NEGATIVE (mg/dL)   Protein, ur NEGATIVE  NEGATIVE (mg/dL)   Urobilinogen, UA 0.2  0.0 - 1.0 (mg/dL)   Nitrite POSITIVE (*) NEGATIVE    Leukocytes, UA SMALL (*) NEGATIVE   URINE MICROSCOPIC-ADD ON      Component Value Range   Squamous Epithelial / LPF FEW (*) RARE    WBC, UA TOO NUMEROUS TO COUNT  <3 (WBC/hpf)   RBC / HPF 11-20  <3 (RBC/hpf)   Bacteria, UA MANY (*) RARE    Dg Lumbar Spine Complete  10/30/2010  *RADIOLOGY REPORT*  Clinical Data: Hip and low back pain.  LUMBAR SPINE - COMPLETE 4+ VIEW  Comparison: CT of 12/26/2005  Findings: Five lumbar-type vertebral bodies.  Sacroiliac joints are symmetric.  Osteoarthritis involves both hips. Radiopaque foreign object about the right side of the abdomen.  Maintenance of vertebral body height and alignment. Severe lumbar spondylosis.  L3- S1.  IMPRESSION: Severe lower lumbar spondylosis, without acute finding.  Original Report Authenticated By: Consuello Bossier, M.D.   Dg Hip Complete Right  10/30/2010  *RADIOLOGY REPORT*  Clinical Data: Right hip and low back pain. No trauma history submitted.  RIGHT HIP - COMPLETE 2+ VIEW  Comparison: None.  Findings: AP view pelvis is degraded secondary to overlying pannus. Both femoral heads are located.  No acute fracture.  Sacroiliac joints are symmetric.  Joint space narrowing and osteophyte formation involve the hips bilaterally.  2 views of the right hip demonstrate no acute fracture. Degenerative changes of the symphysis pubis.  IMPRESSION: Bilateral hip osteoarthritis, without acute finding.  Original Report Authenticated By: Consuello Bossier, M.D.    3:31 PM:  +UTI, UC pending.  Has walked around ED with steady gait, easy resps.  VS remain stable, afebrile.  Wants  to go home now.  Dx testing d/w pt.  Questions answered.  Verb understanding, agreeable to d/c home with outpt f/u.   New Prescriptions   CEPHALEXIN (KEFLEX) 500 MG CAPSULE    Take 1 capsule (500 mg total) by mouth 4 (four) times daily.    Nyara Capell Allison Quarry, DO 11/02/10 1309

## 2010-11-02 LAB — URINE CULTURE

## 2010-11-11 ENCOUNTER — Ambulatory Visit: Payer: Self-pay | Admitting: Family Medicine

## 2010-11-24 LAB — DIFFERENTIAL
Eosinophils Relative: 2
Lymphocytes Relative: 25
Lymphs Abs: 2.3
Monocytes Relative: 6

## 2010-11-24 LAB — BASIC METABOLIC PANEL
BUN: 12
Chloride: 105
GFR calc Af Amer: >60
GFR calc non Af Amer: >60
Potassium: 3.3 — ABNORMAL LOW
Sodium: 142

## 2010-11-24 LAB — CBC
HCT: 41.6
Hemoglobin: 13.6
MCV: 80
Platelets: 233
RBC: 5.2 — ABNORMAL HIGH
WBC: 9.1

## 2010-11-24 LAB — B-NATRIURETIC PEPTIDE (CONVERTED LAB): Pro B Natriuretic peptide (BNP): <30

## 2011-02-09 ENCOUNTER — Other Ambulatory Visit (HOSPITAL_COMMUNITY): Payer: Self-pay | Admitting: "Endocrinology

## 2011-02-09 DIAGNOSIS — R7989 Other specified abnormal findings of blood chemistry: Secondary | ICD-10-CM

## 2011-02-13 ENCOUNTER — Encounter (HOSPITAL_COMMUNITY)
Admission: RE | Admit: 2011-02-13 | Discharge: 2011-02-13 | Disposition: A | Discharge status: Home / Self Care | Payer: Medicare Other | Source: Ambulatory Visit | Attending: "Endocrinology | Admitting: "Endocrinology

## 2011-02-13 ENCOUNTER — Encounter (HOSPITAL_COMMUNITY): Payer: Self-pay

## 2011-02-13 DIAGNOSIS — R946 Abnormal results of thyroid function studies: Secondary | ICD-10-CM | POA: Insufficient documentation

## 2011-02-13 DIAGNOSIS — R7989 Other specified abnormal findings of blood chemistry: Secondary | ICD-10-CM

## 2011-02-13 MED ORDER — SODIUM IODIDE I 131 CAPSULE
10.0000 | Freq: Once | INTRAVENOUS | Status: AC | PRN
  Administered 2011-02-13: 9 via ORAL

## 2011-02-14 ENCOUNTER — Encounter (HOSPITAL_COMMUNITY)
Admission: RE | Admit: 2011-02-14 | Discharge: 2011-02-14 | Disposition: A | Discharge status: Home / Self Care | Payer: Medicare Other | Source: Ambulatory Visit | Attending: "Endocrinology | Admitting: "Endocrinology

## 2011-02-14 DIAGNOSIS — E042 Nontoxic multinodular goiter: Secondary | ICD-10-CM | POA: Insufficient documentation

## 2011-02-14 DIAGNOSIS — R002 Palpitations: Secondary | ICD-10-CM | POA: Insufficient documentation

## 2011-02-14 DIAGNOSIS — L659 Nonscarring hair loss, unspecified: Secondary | ICD-10-CM | POA: Insufficient documentation

## 2011-02-14 DIAGNOSIS — E349 Endocrine disorder, unspecified: Secondary | ICD-10-CM | POA: Insufficient documentation

## 2011-02-14 MED ORDER — SODIUM PERTECHNETATE TC 99M INJECTION
1.0000 | Freq: Once | INTRAVENOUS | Status: AC | PRN
  Administered 2011-02-14: 10 via INTRAVENOUS

## 2011-02-23 ENCOUNTER — Other Ambulatory Visit (HOSPITAL_COMMUNITY): Payer: Self-pay | Admitting: "Endocrinology

## 2011-02-23 DIAGNOSIS — E049 Nontoxic goiter, unspecified: Secondary | ICD-10-CM

## 2011-02-27 ENCOUNTER — Ambulatory Visit (HOSPITAL_COMMUNITY): Payer: Medicare Other

## 2011-03-03 ENCOUNTER — Ambulatory Visit (HOSPITAL_COMMUNITY)
Admission: RE | Admit: 2011-03-03 | Discharge: 2011-03-03 | Disposition: A | Discharge status: Home / Self Care | Payer: Medicare Other | Source: Ambulatory Visit | Attending: "Endocrinology | Admitting: "Endocrinology

## 2011-03-03 DIAGNOSIS — E049 Nontoxic goiter, unspecified: Secondary | ICD-10-CM

## 2011-03-03 DIAGNOSIS — E042 Nontoxic multinodular goiter: Secondary | ICD-10-CM | POA: Insufficient documentation

## 2011-03-30 ENCOUNTER — Ambulatory Visit: Payer: Medicare Other | Admitting: Family Medicine

## 2011-03-30 ENCOUNTER — Encounter: Payer: Self-pay | Admitting: Family Medicine

## 2011-11-20 ENCOUNTER — Encounter: Payer: Self-pay | Admitting: Family Medicine

## 2011-11-20 ENCOUNTER — Encounter: Payer: Self-pay | Admitting: Internal Medicine

## 2011-11-20 ENCOUNTER — Ambulatory Visit: Payer: Medicare Other | Admitting: Family Medicine

## 2011-11-28 ENCOUNTER — Emergency Department (HOSPITAL_COMMUNITY): Payer: Medicare Other

## 2011-11-28 ENCOUNTER — Emergency Department (HOSPITAL_COMMUNITY)
Admission: EM | Admit: 2011-11-28 | Discharge: 2011-11-28 | Disposition: A | Discharge status: Home / Self Care | Payer: Medicare Other | Attending: Emergency Medicine | Admitting: Emergency Medicine

## 2011-11-28 ENCOUNTER — Encounter (HOSPITAL_COMMUNITY): Payer: Self-pay | Admitting: Emergency Medicine

## 2011-11-28 DIAGNOSIS — M1990 Unspecified osteoarthritis, unspecified site: Secondary | ICD-10-CM | POA: Insufficient documentation

## 2011-11-28 DIAGNOSIS — I10 Essential (primary) hypertension: Secondary | ICD-10-CM | POA: Insufficient documentation

## 2011-11-28 DIAGNOSIS — R609 Edema, unspecified: Secondary | ICD-10-CM | POA: Insufficient documentation

## 2011-11-28 DIAGNOSIS — K219 Gastro-esophageal reflux disease without esophagitis: Secondary | ICD-10-CM | POA: Insufficient documentation

## 2011-11-28 DIAGNOSIS — H409 Unspecified glaucoma: Secondary | ICD-10-CM | POA: Insufficient documentation

## 2011-11-28 DIAGNOSIS — Z79899 Other long term (current) drug therapy: Secondary | ICD-10-CM | POA: Insufficient documentation

## 2011-11-28 DIAGNOSIS — E119 Type 2 diabetes mellitus without complications: Secondary | ICD-10-CM | POA: Insufficient documentation

## 2011-11-28 DIAGNOSIS — I2510 Atherosclerotic heart disease of native coronary artery without angina pectoris: Secondary | ICD-10-CM | POA: Insufficient documentation

## 2011-11-28 HISTORY — DX: Gastro-esophageal reflux disease without esophagitis: K21.9

## 2011-11-28 HISTORY — DX: Unspecified glaucoma: H40.9

## 2011-11-28 LAB — COMPREHENSIVE METABOLIC PANEL
BUN: 17 mg/dL (ref 6–23)
CO2: 30 mEq/L (ref 19–32)
Chloride: 104 mEq/L (ref 96–112)
Creatinine, Ser: 0.61 mg/dL (ref 0.50–1.10)
GFR calc Af Amer: >90 mL/min (ref >90)
GFR calc non Af Amer: 85 mL/min — ABNORMAL LOW (ref >90)
Total Bilirubin: 0.4 mg/dL (ref 0.3–1.2)

## 2011-11-28 LAB — CBC WITH DIFFERENTIAL/PLATELET
Eosinophils Relative: 4 % (ref 0–5)
HCT: 37.9 % (ref 36.0–46.0)
Hemoglobin: 12.1 g/dL (ref 12.0–15.0)
Lymphocytes Relative: 26 % (ref 12–46)
MCHC: 31.9 g/dL (ref 30.0–36.0)
MCV: 82.6 fL (ref 78.0–100.0)
Monocytes Absolute: 0.5 10*3/uL (ref 0.1–1.0)
Monocytes Relative: 5 % (ref 3–12)
Neutro Abs: 6 10*3/uL (ref 1.7–7.7)
WBC: 9.4 10*3/uL (ref 4.0–10.5)

## 2011-11-28 NOTE — ED Notes (Signed)
Swelling in both ankles x 6 months and has seen a dr but worse in the last few days and today they feel pain

## 2011-11-28 NOTE — ED Provider Notes (Signed)
History  Scribed for Loren Racer, MD, the patient was seen in room TR08C/TR08C. This chart was scribed by Candelaria Stagers. The patient's care started at 7:56 PM   CSN: [id number]  Arrival date & time 11/28/11  1308   First MD Initiated Contact with Patient 11/28/11 1830      No chief complaint on file.    The history is provided by the patient. No language interpreter was used.   Noely D Brookhouse is a 78 y.o. female who presents to the Emergency Department complaining of bilateral ankle and knee swelling that started several months ago becoming worse over the last few days.  She states that the legs are now painful.  Nothing seems to make the sx better or worse.  She denies any recent abnormal activity.  Pt is currently prescribed Lasix and had reports that she does not take regularly.  Pt has h/o HTN, diabetes, and coronary artery disease.  Pt reports she takes 1 aleve twice a day.   Past Medical History  Diagnosis Date  . Hypertension   . Diabetes mellitus   . Coronary artery disease   . Glaucoma (increased eye pressure)   . Acid reflux     Past Surgical History  Procedure Date  . Cholecystectomy   . Abdominal hysterectomy     No family history on file.  History  Substance Use Topics  . Smoking status: Never Smoker   . Smokeless tobacco: Not on file  . Alcohol Use: No    OB History    Grav Para Term Preterm Abortions TAB SAB Ect Mult Living                  Review of Systems  Musculoskeletal: Positive for joint swelling (legs bilaterally).  All other systems reviewed and are negative.    Allergies  Review of patient's allergies indicates no known allergies.  Home Medications   Current Outpatient Rx  Name Route Sig Dispense Refill  . FUROSEMIDE 40 MG PO TABS Oral Take 40 mg by mouth 3 (three) times daily.      Marland Kitchen GABAPENTIN 600 MG PO TABS Oral Take 600 mg by mouth 3 (three) times daily.      . GLYBURIDE MICRONIZED 3 MG PO TABS Oral Take 3 mg by mouth 2  (two) times daily before a meal.      . LISINOPRIL 20 MG PO TABS Oral Take 20 mg by mouth daily.      Marland Kitchen SIMVASTATIN 20 MG PO TABS Oral Take 20 mg by mouth daily.     Marland Kitchen VERAPAMIL HCL ER 120 MG PO TBCR Oral Take 120 mg by mouth daily.        BP 145/59  Pulse 80  Temp 97.9 F (36.6 C) (Oral)  Resp 18  SpO2 100%  Physical Exam  Nursing note and vitals reviewed. Constitutional: She is oriented to person, place, and time. She appears well-developed and well-nourished. No distress.       Obese   HENT:  Head: Normocephalic and atraumatic.  Eyes: EOM are normal. Pupils are equal, round, and reactive to light.  Neck: Neck supple. No tracheal deviation present.  Cardiovascular: Normal rate.   Pulmonary/Chest: Effort normal. No respiratory distress.  Musculoskeletal: Normal range of motion. She exhibits edema.       2+ pitting edema to the ankles bilaterally.  Mild pain with ROM bilaterally.  No erythema. No calf tenderness.   Neurological: She is alert and oriented to person, place,  and time.  Skin: Skin is warm and dry.  Psychiatric: She has a normal mood and affect. Her behavior is normal.    ED Course  Procedures   DIAGNOSTIC STUDIES: Oxygen Saturation is 100% on room air, normal by my interpretation.    COORDINATION OF CARE:  13:18 Ordered: DG Chest 2 View; CBC with Differential; Comprehensive metabolic panel   Labs Reviewed  COMPREHENSIVE METABOLIC PANEL - Abnormal; Notable for the following:    Glucose, Bld 118 (*)     Albumin 3.1 (*)     GFR calc non Af Amer 85 (*)     All other components within normal limits  CBC WITH DIFFERENTIAL   Dg Chest 2 View  11/28/2011  *RADIOLOGY REPORT*  Clinical Data: Shortness of breath  CHEST - 2 VIEW  Comparison: September 05, 2008  Findings:  There are is no edema or consolidation.  There are scattered tiny granulomas.  Heart size and pulmonary vascularity are normal.  No adenopathy. There is degenerative change in the thoracic spine.   IMPRESSION: Scattered tiny granulomas.  No edema or consolidation. Stable chest.   Original Report Authenticated By: Arvin Collard. WOODRUFF III, M.D.      1. Peripheral edema   2. Osteoarthritis       MDM  I personally performed the services described in this documentation, which was scribed in my presence. The recorded information has been reviewed and considered.  Advised to take Lasix as prescribed. Keep legs elevated when sitting. Purchase and wear compression stockings. Take NSAID's as needed for pain. F/u with PMD      Loren Racer, MD 11/28/11 1956

## 2011-11-28 NOTE — ED Notes (Signed)
Feels sob when she walks also she states

## 2011-12-04 ENCOUNTER — Encounter: Payer: Medicare Other | Admitting: Family Medicine

## 2011-12-08 ENCOUNTER — Ambulatory Visit: Payer: Medicare Other | Admitting: Family Medicine

## 2012-07-23 ENCOUNTER — Ambulatory Visit (INDEPENDENT_AMBULATORY_CARE_PROVIDER_SITE_OTHER): Payer: Medicare Other

## 2012-07-23 ENCOUNTER — Ambulatory Visit (INDEPENDENT_AMBULATORY_CARE_PROVIDER_SITE_OTHER): Payer: Medicare Other | Admitting: Orthopedic Surgery

## 2012-07-23 VITALS — BP 112/76 | Ht 63.0 in | Wt 229.0 lb

## 2012-07-23 DIAGNOSIS — M25569 Pain in unspecified knee: Secondary | ICD-10-CM

## 2012-07-23 DIAGNOSIS — M25561 Pain in right knee: Secondary | ICD-10-CM

## 2012-07-23 DIAGNOSIS — M1710 Unilateral primary osteoarthritis, unspecified knee: Secondary | ICD-10-CM

## 2012-07-23 DIAGNOSIS — M25562 Pain in left knee: Secondary | ICD-10-CM

## 2012-07-23 MED ORDER — TRAMADOL-ACETAMINOPHEN 37.5-325 MG PO TABS: 1.0000 | ORAL_TABLET | ORAL | Status: DC | PRN

## 2012-07-23 NOTE — Patient Instructions (Addendum)
You have received a steroid shot. 15% of patients experience increased pain at the injection site with in the next 24 hours. This is best treated with ice and tylenol extra strength 2 tabs every 8 hours. If you are still having pain please call the office.    

## 2012-07-24 ENCOUNTER — Encounter: Payer: Self-pay | Admitting: Orthopedic Surgery

## 2012-07-24 DIAGNOSIS — M1710 Unilateral primary osteoarthritis, unspecified knee: Secondary | ICD-10-CM | POA: Insufficient documentation

## 2012-07-24 NOTE — Progress Notes (Signed)
Patient ID: [name], female   DOB: Jan 08, 1932, 79 y.o.   MRN: [id number] Chief Complaint  Patient presents with  . Knee Pain    Bilateral knee and ankle pain and swelling, also pain in back when standing    Patient history: This is a 79 year old female who presents with bilateral knee pain back pain ankle swelling. Her knee pain is described as dull throbbing aching constant with intermittent exacerbations unrelieved by local measures and self treating measures such as ice heat over-the-counter medication  Her review of systems are recorded in the section of the chart  Examination this is a well-developed well-nourished obese black female who appears younger than her stated age of 42. Her vital signs are stable BP 112/76  Ht 5\' 3"  (1.6 m)  Wt 229 lb (103.874 kg)  BMI 40.58 kg/m2 She is oriented x3 mood and affect are normal she walks with a poor gait pattern with poor step and stride length decreased cadence. She uses assistive device.  Her upper extremity seemed to be functioning fine with slight decreased range of motion in the left shoulder. Shoulder stability confirm by apprehension test. strength test is normal with normal muscle tone skin is intact. There are no axillary lymph nodes pulses are good in both arms temperature normal sensation intact.  As far as her knee scope she has decreased range of motion limited to 95 of flexion with flexion contracture 5 medial and lateral joint line tenderness tenderness over the medial pes bursa.  Those stability deficits were noted muscle tone was normal strength was intact skin was normal pulses were good difficulty with peripheral edema and pitting sensation was normal no lymph nodes no pathologic reflexes coordination balance intact  Tenderness in the lower back no increased muscle tension no instability skin normal no masses.  X-ray show bilateral osteoarthritis of the knee  Diagnoses #1 osteoarthritis both knees. She it is very  much overweight chest medical problems as noted in her history she is a candidate for injection but not surgery until she can lose some weight  Diagnosis #2 lower back pain chronic x-rays done elsewhere show degenerative disc disease. As I do not think she's a surgical candidate as her pain is not radicular in her no neurologic deficits  We injected both knees  Knee  Injection Procedure Note  Pre-operative Diagnosis: left knee oa  Post-operative Diagnosis: same  Indications: pain  Anesthesia: ethyl chloride   Procedure Details   Verbal consent was obtained for the procedure. Time out was completed.The joint was prepped with alcohol, followed by  Ethyl chloride spray and A 20 gauge needle was inserted into the knee via lateral approach; 4ml 1% lidocaine and 1 ml of depomedrol  was then injected into the joint . The needle was removed and the area cleansed and dressed.  Complications:  None; patient tolerated the procedure well. Knee  Injection Procedure Note  Pre-operative Diagnosis: right knee oa  Post-operative Diagnosis: same  Indications: pain  Anesthesia: ethyl chloride   Procedure Details   Verbal consent was obtained for the procedure. Time out was completed.The joint was prepped with alcohol, followed by  Ethyl chloride spray and A 20 gauge needle was inserted into the knee via lateral approach; 4ml 1% lidocaine and 1 ml of depomedrol  was then injected into the joint . The needle was removed and the area cleansed and dressed.  Complications:  None; patient tolerated the procedure well.

## 2013-11-27 ENCOUNTER — Encounter: Payer: Self-pay | Admitting: Adult Health

## 2013-11-27 ENCOUNTER — Ambulatory Visit (INDEPENDENT_AMBULATORY_CARE_PROVIDER_SITE_OTHER): Payer: Medicare HMO | Admitting: Adult Health

## 2013-11-27 VITALS — BP 160/70 | HR 78 | Ht 63.5 in | Wt 231.0 lb

## 2013-11-27 DIAGNOSIS — Z1212 Encounter for screening for malignant neoplasm of rectum: Secondary | ICD-10-CM

## 2013-11-27 DIAGNOSIS — R319 Hematuria, unspecified: Secondary | ICD-10-CM

## 2013-11-27 DIAGNOSIS — B369 Superficial mycosis, unspecified: Secondary | ICD-10-CM | POA: Insufficient documentation

## 2013-11-27 DIAGNOSIS — Z01419 Encounter for gynecological examination (general) (routine) without abnormal findings: Secondary | ICD-10-CM

## 2013-11-27 DIAGNOSIS — R300 Dysuria: Secondary | ICD-10-CM

## 2013-11-27 HISTORY — DX: Hematuria, unspecified: R31.9

## 2013-11-27 HISTORY — DX: Superficial mycosis, unspecified: B36.9

## 2013-11-27 HISTORY — DX: Dysuria: R30.0

## 2013-11-27 LAB — HEMOCCULT GUIAC POC 1CARD (OFFICE): Fecal Occult Blood, POC: NEGATIVE

## 2013-11-27 LAB — POCT URINALYSIS DIPSTICK
Glucose, UA: NEGATIVE
LEUKOCYTES UA: NEGATIVE
Nitrite, UA: NEGATIVE
PROTEIN UA: NEGATIVE

## 2013-11-27 MED ORDER — CEPHALEXIN 500 MG PO CAPS: 500.0000 mg | ORAL_CAPSULE | Freq: Four times a day (QID) | ORAL | Status: DC

## 2013-11-27 MED ORDER — NYSTATIN 100000 UNIT/GM EX POWD
CUTANEOUS | Status: DC
Start: 2013-11-27 — End: 2023-07-06

## 2013-11-27 NOTE — Patient Instructions (Signed)
Yeast Infection of the Skin Some yeast on the skin is normal, but sometimes it causes an infection. If you have a yeast infection, it shows up as white or light brown patches on brown skin. You can see it better in the summer on tan skin. It causes light-colored holes in your suntan. It can happen on any area of the body. This cannot be passed from person to person. HOME CARE  Scrub your skin daily with a dandruff shampoo. Your rash may take a couple weeks to get well.  Do not scratch or itch the rash. GET HELP RIGHT AWAY IF:   You get another infection from scratching. The skin may get warm, red, and may ooze fluid.  The infection does not seem to be getting better. MAKE SURE YOU:  Understand these instructions.  Will watch your condition.  Will get help right away if you are not doing well or get worse. Document Released: 01/27/2008 Document Revised: 05/08/2011 Document Reviewed: 01/27/2008 [facility] Patient Information 2015 [place], [place]. This information is not intended to replace advice given to you by your health care provider. Make sure you discuss any questions you have with your health care provider. Urinary Tract Infection Urinary tract infections (UTIs) can develop anywhere along your urinary tract. Your urinary tract is your body's drainage system for removing wastes and extra water. Your urinary tract includes two kidneys, two ureters, a bladder, and a urethra. Your kidneys are a pair of bean-shaped organs. Each kidney is about the size of your fist. They are located below your ribs, one on each side of your spine. CAUSES Infections are caused by microbes, which are microscopic organisms, including fungi, viruses, and bacteria. These organisms are so small that they can only be seen through a microscope. Bacteria are the microbes that most commonly cause UTIs. SYMPTOMS  Symptoms of UTIs may vary by age and gender of the patient and by the location of the infection. Symptoms in  young women typically include a frequent and intense urge to urinate and a painful, burning feeling in the bladder or urethra during urination. Older women and men are more likely to be tired, shaky, and weak and have muscle aches and abdominal pain. A fever may mean the infection is in your kidneys. Other symptoms of a kidney infection include pain in your back or sides below the ribs, nausea, and vomiting. DIAGNOSIS To diagnose a UTI, your caregiver will ask you about your symptoms. Your caregiver also will ask to provide a urine sample. The urine sample will be tested for bacteria and white blood cells. White blood cells are made by your body to help fight infection. TREATMENT  Typically, UTIs can be treated with medication. Because most UTIs are caused by a bacterial infection, they usually can be treated with the use of antibiotics. The choice of antibiotic and length of treatment depend on your symptoms and the type of bacteria causing your infection. HOME CARE INSTRUCTIONS  If you were prescribed antibiotics, take them exactly as your caregiver instructs you. Finish the medication even if you feel better after you have only taken some of the medication.  Drink enough water and fluids to keep your urine clear or pale yellow.  Avoid caffeine, tea, and carbonated beverages. They tend to irritate your bladder.  Empty your bladder often. Avoid holding urine for long periods of time.  Empty your bladder before and after sexual intercourse.  After a bowel movement, women should cleanse from front to back. Use  each tissue only once. SEEK MEDICAL CARE IF:   You have back pain.  You develop a fever.  Your symptoms do not begin to resolve within 3 days. SEEK IMMEDIATE MEDICAL CARE IF:   You have severe back pain or lower abdominal pain.  You develop chills.  You have nausea or vomiting.  You have continued burning or discomfort with urination. MAKE SURE YOU:   Understand these  instructions.  Will watch your condition.  Will get help right away if you are not doing well or get worse. Document Released: 11/23/2004 Document Revised: 08/15/2011 Document Reviewed: 03/24/2011 [facility] Patient Information 2015 [place]. This information is not intended to replace advice given to you by your health care provider. Make sure you discuss any questions you have with your health care provider. Physical in 2 year

## 2013-11-27 NOTE — Progress Notes (Signed)
Patient ID: [name], female   DOB: 12/22/1931, 80 y.o.   MRN: [id number] History of Present Illness: Donique is a 80 year old black female in for gyn physical.She is complaining of burning with urination and rash in groin at times.   Current Medications, Allergies, Past Medical History, Past Surgical History, Family History and Social History were [facility].     Review of Systems: Patient denies any headaches, blurred vision, shortness of breath, chest pain, abdominal pain, problems with bowel movements, urination, or intercourse. Not having sex, has pain in feet and legs has arthritis and they swell, no mood swings.    Physical Exam:BP 160/70  Pulse 78  Ht 5' 3.5" (1.613 m)  Wt 231 lb (104.781 kg)  BMI 40.27 kg/m2urine trace blood General:  Well developed, well nourished, no acute distress Skin:  Warm and dry Neck:  Midline trachea, normal thyroid, no carotid bruits heard Lungs; Clear to auscultation bilaterally Breast:  No dominant palpable mass, retraction, or nipple discharge Cardiovascular: Regular rate and rhythm Abdomen:  Soft, non tender, no hepatosplenomegaly Pelvic:  External genitalia is normal in appearance for age, no lesions  The vagina atrophic.               The cervix and uterus are absent.  No  adnexal masses or tenderness noted. Rectal: Good sphincter tone, no polyps, or hemorrhoids felt.  Hemoccult negative. Extremities:  No varicosities noted, has swelling in feet and ankles Psych:  No mood changes, alert and cooperative,seems happy, she walks with a cane, but she drives and does her own cooking and house work.   Impression: Well woman gyn exam no pap Hematuria Burning with urination Skin fungus   Plan: UA C&S sent Rx keflex 500 mg 1 bid x 7 days  Rx nystatin powder to use 2-3 x daily prn to affected area Physical in 2 years Mammogram yearly  Labs with Dr Felecia ShellingFanta Review handouts on skin fungus and UTI

## 2013-11-28 LAB — URINE CULTURE

## 2013-11-28 LAB — URINALYSIS
Bilirubin Urine: NEGATIVE
GLUCOSE, UA: NEGATIVE mg/dL
HGB URINE DIPSTICK: NEGATIVE
KETONES UR: NEGATIVE mg/dL
NITRITE: NEGATIVE
PH: 5.5 (ref 5.0–8.0)
Protein, ur: NEGATIVE mg/dL
SPECIFIC GRAVITY, URINE: 1.018 (ref 1.005–1.030)
Urobilinogen, UA: 0.2 mg/dL (ref 0.0–1.0)

## 2013-12-05 ENCOUNTER — Other Ambulatory Visit (HOSPITAL_COMMUNITY): Payer: Self-pay | Admitting: Internal Medicine

## 2013-12-05 DIAGNOSIS — M810 Age-related osteoporosis without current pathological fracture: Secondary | ICD-10-CM

## 2013-12-10 ENCOUNTER — Ambulatory Visit (HOSPITAL_COMMUNITY)
Admission: RE | Admit: 2013-12-10 | Discharge: 2013-12-10 | Disposition: A | Discharge status: Home / Self Care | Payer: Medicare HMO | Source: Ambulatory Visit | Attending: Internal Medicine | Admitting: Internal Medicine

## 2013-12-10 DIAGNOSIS — M810 Age-related osteoporosis without current pathological fracture: Secondary | ICD-10-CM | POA: Diagnosis not present

## 2013-12-29 ENCOUNTER — Encounter: Payer: Self-pay | Admitting: Adult Health

## 2014-09-17 ENCOUNTER — Ambulatory Visit (INDEPENDENT_AMBULATORY_CARE_PROVIDER_SITE_OTHER): Payer: Medicare HMO | Admitting: Orthopedic Surgery

## 2014-09-17 ENCOUNTER — Encounter: Payer: Self-pay | Admitting: Orthopedic Surgery

## 2014-09-17 ENCOUNTER — Ambulatory Visit (INDEPENDENT_AMBULATORY_CARE_PROVIDER_SITE_OTHER): Payer: Medicare HMO

## 2014-09-17 VITALS — BP 160/79 | Ht 61.0 in | Wt 226.0 lb

## 2014-09-17 DIAGNOSIS — M25561 Pain in right knee: Secondary | ICD-10-CM

## 2014-09-17 DIAGNOSIS — M1711 Unilateral primary osteoarthritis, right knee: Secondary | ICD-10-CM

## 2014-09-17 NOTE — Progress Notes (Signed)
Patient ID: [name], female   DOB: 11-15-1931, 81 y.o.   MRN: [id number] Re - evaluation  Chief Complaint  Patient presents with  . Knee Pain    right knee pain, no known injury, referred by DR Felecia Shelling    HPI [name] is a 81 y.o. female.  Who presents for reevaluation of her ongoing right knee arthritis last seen in 2014 giving cortisone injection advised to lose weight because her BMI was over 40. She presents back with a 10 pound weight loss and a BMI of 42, diabetes hypertension and coronary artery disease with increasing knee pain. She is currently ambulating with a cane  Her activities of daily living are becoming more difficult. She has severe pain in the right knee mild to moderate pain in the left knee with decreased range of motion and inability to easily stand from a seated position  Review of systems she notes some fatigue with activities. This is most likely related to her obesity. She has intermittent issues with fungal infections in her skin. She has giving way symptoms in her knees catching and locking in her knees.     Past Medical History  Diagnosis Date  . Hypertension   . Diabetes mellitus   . Coronary artery disease   . Glaucoma (increased eye pressure)   . Acid reflux   . Arthritis   . Hematuria 11/27/2013  . Burning with urination 11/27/2013  . Superficial fungus infection of skin 11/27/2013    Past Surgical History  Procedure Laterality Date  . Cholecystectomy    . Abdominal hysterectomy       No Known Allergies  Current Outpatient Prescriptions  Medication Sig Dispense Refill  . cephALEXin (KEFLEX) 500 MG capsule Take 1 capsule (500 mg total) by mouth 4 (four) times daily. 28 capsule 0  . furosemide (LASIX) 40 MG tablet Take 40 mg by mouth 3 (three) times daily.      Marland Kitchen gabapentin (NEURONTIN) 600 MG tablet Take 600 mg by mouth 3 (three) times daily.      Marland Kitchen glyBURIDE micronized (GLYNASE) 3 MG tablet Take 3 mg by mouth 2 (two) times daily  before a meal.      . lisinopril (PRINIVIL,ZESTRIL) 20 MG tablet Take 20 mg by mouth daily.      . metFORMIN (GLUCOPHAGE) 500 MG tablet     . nystatin (MYCOSTATIN/NYSTOP) 100000 UNIT/GM POWD Use 2-3 x daily to affected area prn 30 g 3  . simvastatin (ZOCOR) 20 MG tablet Take 20 mg by mouth daily.     . verapamil (CALAN-SR) 120 MG CR tablet Take 120 mg by mouth daily.       No current facility-administered medications for this visit.    Review of Systems Review of Systems  See the history   Physical Exam Blood pressure 160/79, height  (1.549 m), weight 226 lb (102.513 kg).  Physical Exam  Data Reviewed I ordered an x-ray of her knee it shows that her knee arthritis is actually worse in the lateral compartment compared to the x-ray that we did in 2014 she has severe valgus arthritis and severe patellofemoral arthritis with mild medial compartment arthritis  Assessment    BMI still 42 and she has some medical problems which are significant enough to warrant being cautious with surgical intervention. Repeat injection    Plan    Follow-up as needed  Continue weight loss with a goal of 200 pounds  Procedure note right knee injection verbal  consent was obtained to inject right knee joint  Timeout was completed to confirm the site of injection  The medications used were 40 mg of Depo-Medrol and 1% lidocaine 3 cc  Anesthesia was provided by ethyl chloride and the skin was prepped with alcohol.  After cleaning the skin with alcohol a 20-gauge needle was used to inject the right knee joint. There were no complications. A sterile bandage was applied.         Fuller Canada 09/17/2014, 10:51 AM

## 2014-09-17 NOTE — Patient Instructions (Addendum)
Joint Injection  Care After  Refer to this sheet in the next few days. These instructions provide you with information on caring for yourself after you have had a joint injection. Your caregiver also may give you more specific instructions. Your treatment has been planned according to current medical practices, but problems sometimes occur. Call your caregiver if you have any problems or questions after your procedure.  After any type of joint injection, it is not uncommon to experience:  · Soreness, swelling, or bruising around the injection site.  · Mild numbness, tingling, or weakness around the injection site caused by the numbing medicine used before or with the injection.  It also is possible to experience the following effects associated with the specific agent after injection:  · Iodine-based contrast agents:  ¨ Allergic reaction (itching, hives, widespread redness, and swelling beyond the injection site).  · Corticosteroids (These effects are rare.):  ¨ Allergic reaction.  ¨ Increased blood sugar levels (If you have diabetes and you notice that your blood sugar levels have increased, notify your caregiver).  ¨ Increased blood pressure levels.  ¨ Mood swings.  · Hyaluronic acid in the use of viscosupplementation.  ¨ Temporary heat or redness.  ¨ Temporary rash and itching.  ¨ Increased fluid accumulation in the injected joint.  These effects all should resolve within a day after your procedure.   HOME CARE INSTRUCTIONS  · Limit yourself to light activity the day of your procedure. Avoid lifting heavy objects, bending, stooping, or twisting.  · Take prescription or over-the-counter pain medication as directed by your caregiver.  · You may apply ice to your injection site to reduce pain and swelling the day of your procedure. Ice may be applied 03-04 times:  ¨ Put ice in a plastic bag.  ¨ Place a towel between your skin and the bag.  ¨ Leave the ice on for no longer than 15-20 minutes each time.  SEEK  IMMEDIATE MEDICAL CARE IF:   · Pain and swelling get worse rather than better or extend beyond the injection site.  · Numbness does not go away.  · Blood or fluid continues to leak from the injection site.  · You have chest pain.  · You have swelling of your face or tongue.  · You have trouble breathing or you become dizzy.  · You develop a fever, chills, or severe tenderness at the injection site that last longer than 1 day.  MAKE SURE YOU:  · Understand these instructions.  · Watch your condition.  · Get help right away if you are not doing well or if you get worse.  Document Released: 10/27/2010 Document Revised: 05/08/2011 Document Reviewed: 10/27/2010  ExitCare® Patient Information ©2015 [employer]. This information is not intended to replace advice given to you by your health care provider. Make sure you discuss any questions you have with your health care provider.

## 2015-02-12 ENCOUNTER — Other Ambulatory Visit (HOSPITAL_COMMUNITY)
Admission: RE | Admit: 2015-02-12 | Discharge: 2015-02-12 | Disposition: A | Discharge status: Home / Self Care | Payer: Commercial Managed Care - HMO | Source: Ambulatory Visit | Attending: Internal Medicine | Admitting: Internal Medicine

## 2015-02-12 DIAGNOSIS — E1165 Type 2 diabetes mellitus with hyperglycemia: Secondary | ICD-10-CM | POA: Diagnosis present

## 2015-02-12 LAB — CBC WITH DIFFERENTIAL/PLATELET
BASOS ABS: 0.1 10*3/uL (ref 0.0–0.1)
Basophils Relative: 1 %
EOS ABS: 0.2 10*3/uL (ref 0.0–0.7)
EOS PCT: 3 %
HCT: 36.4 % (ref 36.0–46.0)
Hemoglobin: 11.4 g/dL — ABNORMAL LOW (ref 12.0–15.0)
LYMPHS PCT: 27 %
Lymphs Abs: 2 10*3/uL (ref 0.7–4.0)
MCH: 26 pg (ref 26.0–34.0)
MCHC: 31.3 g/dL (ref 30.0–36.0)
MCV: 83.1 fL (ref 78.0–100.0)
MONO ABS: 0.6 10*3/uL (ref 0.1–1.0)
Monocytes Relative: 8 %
Neutro Abs: 4.7 10*3/uL (ref 1.7–7.7)
Neutrophils Relative %: 61 %
PLATELETS: 244 10*3/uL (ref 150–400)
RBC: 4.38 MIL/uL (ref 3.87–5.11)
RDW: 14.1 % (ref 11.5–15.5)
WBC: 7.5 10*3/uL (ref 4.0–10.5)

## 2015-02-12 LAB — BASIC METABOLIC PANEL
Anion gap: 9 (ref 5–15)
BUN: 12 mg/dL (ref 6–20)
CALCIUM: 9.5 mg/dL (ref 8.9–10.3)
CO2: 27 mmol/L (ref 22–32)
Chloride: 103 mmol/L (ref 101–111)
Creatinine, Ser: 0.6 mg/dL (ref 0.44–1.00)
GFR calc Af Amer: >60 mL/min (ref >60)
GLUCOSE: 95 mg/dL (ref 65–99)
Potassium: 3.1 mmol/L — ABNORMAL LOW (ref 3.5–5.1)
Sodium: 139 mmol/L (ref 135–145)

## 2015-02-12 LAB — HEPATIC FUNCTION PANEL
ALBUMIN: 3.4 g/dL — AB (ref 3.5–5.0)
ALT: 9 U/L — AB (ref 14–54)
AST: 16 U/L (ref 15–41)
Alkaline Phosphatase: 78 U/L (ref 38–126)
BILIRUBIN DIRECT: 0.2 mg/dL (ref 0.1–0.5)
Indirect Bilirubin: 0.5 mg/dL (ref 0.3–0.9)
TOTAL PROTEIN: 6.9 g/dL (ref 6.5–8.1)
Total Bilirubin: 0.7 mg/dL (ref 0.3–1.2)

## 2015-02-12 LAB — LIPID PANEL
CHOL/HDL RATIO: 1.9 ratio
Cholesterol: 178 mg/dL (ref 0–200)
HDL: 92 mg/dL (ref >40)
LDL CALC: 67 mg/dL (ref 0–99)
TRIGLYCERIDES: 94 mg/dL (ref <150)
VLDL: 19 mg/dL (ref 0–40)

## 2015-02-13 LAB — HEMOGLOBIN A1C
HEMOGLOBIN A1C: 6.3 % — AB (ref 4.8–5.6)
MEAN PLASMA GLUCOSE: 134 mg/dL

## 2015-07-09 DIAGNOSIS — M542 Cervicalgia: Secondary | ICD-10-CM | POA: Diagnosis not present

## 2015-07-09 DIAGNOSIS — E1140 Type 2 diabetes mellitus with diabetic neuropathy, unspecified: Secondary | ICD-10-CM | POA: Diagnosis not present

## 2015-07-09 DIAGNOSIS — N390 Urinary tract infection, site not specified: Secondary | ICD-10-CM | POA: Diagnosis not present

## 2015-07-09 DIAGNOSIS — E1010 Type 1 diabetes mellitus with ketoacidosis without coma: Secondary | ICD-10-CM | POA: Diagnosis not present

## 2015-07-12 ENCOUNTER — Other Ambulatory Visit (HOSPITAL_COMMUNITY): Payer: Self-pay | Admitting: Internal Medicine

## 2015-07-12 ENCOUNTER — Ambulatory Visit (HOSPITAL_COMMUNITY)
Admission: RE | Admit: 2015-07-12 | Discharge: 2015-07-12 | Disposition: A | Discharge status: Home / Self Care | Payer: Medicare Other | Source: Ambulatory Visit | Attending: Internal Medicine | Admitting: Internal Medicine

## 2015-07-12 DIAGNOSIS — M542 Cervicalgia: Secondary | ICD-10-CM

## 2015-07-12 DIAGNOSIS — M47892 Other spondylosis, cervical region: Secondary | ICD-10-CM | POA: Insufficient documentation

## 2015-10-12 DIAGNOSIS — E1140 Type 2 diabetes mellitus with diabetic neuropathy, unspecified: Secondary | ICD-10-CM | POA: Diagnosis not present

## 2015-10-12 DIAGNOSIS — J449 Chronic obstructive pulmonary disease, unspecified: Secondary | ICD-10-CM | POA: Diagnosis not present

## 2015-10-12 DIAGNOSIS — I10 Essential (primary) hypertension: Secondary | ICD-10-CM | POA: Diagnosis not present

## 2015-10-12 DIAGNOSIS — M1990 Unspecified osteoarthritis, unspecified site: Secondary | ICD-10-CM | POA: Diagnosis not present

## 2015-10-12 DIAGNOSIS — E785 Hyperlipidemia, unspecified: Secondary | ICD-10-CM | POA: Diagnosis not present

## 2015-10-13 DIAGNOSIS — L110 Acquired keratosis follicularis: Secondary | ICD-10-CM | POA: Diagnosis not present

## 2015-10-13 DIAGNOSIS — E1151 Type 2 diabetes mellitus with diabetic peripheral angiopathy without gangrene: Secondary | ICD-10-CM | POA: Diagnosis not present

## 2015-10-13 DIAGNOSIS — E1140 Type 2 diabetes mellitus with diabetic neuropathy, unspecified: Secondary | ICD-10-CM | POA: Diagnosis not present

## 2015-10-13 DIAGNOSIS — L609 Nail disorder, unspecified: Secondary | ICD-10-CM | POA: Diagnosis not present

## 2015-10-15 ENCOUNTER — Other Ambulatory Visit (HOSPITAL_COMMUNITY)
Admission: RE | Admit: 2015-10-15 | Discharge: 2015-10-15 | Disposition: A | Discharge status: Home / Self Care | Payer: Medicare Other | Source: Ambulatory Visit | Attending: Internal Medicine | Admitting: Internal Medicine

## 2015-10-15 DIAGNOSIS — M1990 Unspecified osteoarthritis, unspecified site: Secondary | ICD-10-CM | POA: Diagnosis not present

## 2015-10-15 DIAGNOSIS — R609 Edema, unspecified: Secondary | ICD-10-CM | POA: Diagnosis not present

## 2015-10-15 DIAGNOSIS — E785 Hyperlipidemia, unspecified: Secondary | ICD-10-CM | POA: Insufficient documentation

## 2015-10-15 DIAGNOSIS — J449 Chronic obstructive pulmonary disease, unspecified: Secondary | ICD-10-CM | POA: Insufficient documentation

## 2015-10-15 DIAGNOSIS — I10 Essential (primary) hypertension: Secondary | ICD-10-CM | POA: Insufficient documentation

## 2015-10-15 DIAGNOSIS — E1140 Type 2 diabetes mellitus with diabetic neuropathy, unspecified: Secondary | ICD-10-CM | POA: Diagnosis not present

## 2015-10-15 LAB — CBC WITH DIFFERENTIAL/PLATELET
BASOS ABS: 0 10*3/uL (ref 0.0–0.1)
BASOS PCT: 0 %
EOS ABS: 0.2 10*3/uL (ref 0.0–0.7)
EOS PCT: 2 %
HCT: 36.3 % (ref 36.0–46.0)
HEMOGLOBIN: 11.6 g/dL — AB (ref 12.0–15.0)
LYMPHS ABS: 2.4 10*3/uL (ref 0.7–4.0)
LYMPHS PCT: 33 %
MCH: 26.8 pg (ref 26.0–34.0)
MCHC: 32 g/dL (ref 30.0–36.0)
MCV: 83.8 fL (ref 78.0–100.0)
Monocytes Absolute: 0.4 10*3/uL (ref 0.1–1.0)
Monocytes Relative: 6 %
NEUTROS ABS: 4.2 10*3/uL (ref 1.7–7.7)
NEUTROS PCT: 59 %
PLATELETS: 241 10*3/uL (ref 150–400)
RBC: 4.33 MIL/uL (ref 3.87–5.11)
RDW: 14.4 % (ref 11.5–15.5)
WBC: 7.2 10*3/uL (ref 4.0–10.5)

## 2015-10-15 LAB — HEPATIC FUNCTION PANEL
ALK PHOS: 72 U/L (ref 38–126)
ALT: 6 U/L — ABNORMAL LOW (ref 14–54)
AST: 12 U/L — ABNORMAL LOW (ref 15–41)
Albumin: 3.3 g/dL — ABNORMAL LOW (ref 3.5–5.0)
BILIRUBIN DIRECT: 0.1 mg/dL (ref 0.1–0.5)
BILIRUBIN INDIRECT: 0.7 mg/dL (ref 0.3–0.9)
TOTAL PROTEIN: 6.7 g/dL (ref 6.5–8.1)
Total Bilirubin: 0.8 mg/dL (ref 0.3–1.2)

## 2015-10-15 LAB — BASIC METABOLIC PANEL
Anion gap: 5 (ref 5–15)
BUN: 15 mg/dL (ref 6–20)
CALCIUM: 9.5 mg/dL (ref 8.9–10.3)
CO2: 29 mmol/L (ref 22–32)
CREATININE: 0.62 mg/dL (ref 0.44–1.00)
Chloride: 107 mmol/L (ref 101–111)
GFR calc Af Amer: >60 mL/min (ref >60)
Glucose, Bld: 94 mg/dL (ref 65–99)
POTASSIUM: 4 mmol/L (ref 3.5–5.1)
SODIUM: 141 mmol/L (ref 135–145)

## 2015-10-15 LAB — LIPID PANEL
CHOL/HDL RATIO: 1.9 ratio
CHOLESTEROL: 171 mg/dL (ref 0–200)
HDL: 90 mg/dL (ref >40)
LDL Cholesterol: 70 mg/dL (ref 0–99)
Triglycerides: 53 mg/dL (ref <150)
VLDL: 11 mg/dL (ref 0–40)

## 2015-10-16 LAB — HEMOGLOBIN A1C
Hgb A1c MFr Bld: 5.8 % — ABNORMAL HIGH (ref 4.8–5.6)
MEAN PLASMA GLUCOSE: 120 mg/dL

## 2015-12-16 ENCOUNTER — Other Ambulatory Visit: Payer: Medicare HMO | Admitting: Adult Health

## 2016-01-14 DIAGNOSIS — J449 Chronic obstructive pulmonary disease, unspecified: Secondary | ICD-10-CM | POA: Diagnosis not present

## 2016-01-14 DIAGNOSIS — I10 Essential (primary) hypertension: Secondary | ICD-10-CM | POA: Diagnosis not present

## 2016-01-14 DIAGNOSIS — E1140 Type 2 diabetes mellitus with diabetic neuropathy, unspecified: Secondary | ICD-10-CM | POA: Diagnosis not present

## 2016-01-14 DIAGNOSIS — N390 Urinary tract infection, site not specified: Secondary | ICD-10-CM | POA: Diagnosis not present

## 2016-01-14 DIAGNOSIS — M1990 Unspecified osteoarthritis, unspecified site: Secondary | ICD-10-CM | POA: Diagnosis not present

## 2016-01-14 DIAGNOSIS — Z23 Encounter for immunization: Secondary | ICD-10-CM | POA: Diagnosis not present

## 2016-05-12 DIAGNOSIS — R609 Edema, unspecified: Secondary | ICD-10-CM | POA: Diagnosis not present

## 2016-05-12 DIAGNOSIS — N390 Urinary tract infection, site not specified: Secondary | ICD-10-CM | POA: Diagnosis not present

## 2016-05-12 DIAGNOSIS — E785 Hyperlipidemia, unspecified: Secondary | ICD-10-CM | POA: Diagnosis not present

## 2016-05-12 DIAGNOSIS — E1140 Type 2 diabetes mellitus with diabetic neuropathy, unspecified: Secondary | ICD-10-CM | POA: Diagnosis not present

## 2016-05-12 DIAGNOSIS — I10 Essential (primary) hypertension: Secondary | ICD-10-CM | POA: Diagnosis not present

## 2016-05-12 DIAGNOSIS — J449 Chronic obstructive pulmonary disease, unspecified: Secondary | ICD-10-CM | POA: Diagnosis not present

## 2016-05-16 DIAGNOSIS — J449 Chronic obstructive pulmonary disease, unspecified: Secondary | ICD-10-CM | POA: Diagnosis not present

## 2016-05-16 DIAGNOSIS — E1140 Type 2 diabetes mellitus with diabetic neuropathy, unspecified: Secondary | ICD-10-CM | POA: Diagnosis not present

## 2016-05-16 DIAGNOSIS — M1990 Unspecified osteoarthritis, unspecified site: Secondary | ICD-10-CM | POA: Diagnosis not present

## 2016-05-16 DIAGNOSIS — I10 Essential (primary) hypertension: Secondary | ICD-10-CM | POA: Diagnosis not present

## 2016-05-16 DIAGNOSIS — R609 Edema, unspecified: Secondary | ICD-10-CM | POA: Diagnosis not present

## 2016-08-10 DIAGNOSIS — J449 Chronic obstructive pulmonary disease, unspecified: Secondary | ICD-10-CM | POA: Diagnosis not present

## 2016-08-10 DIAGNOSIS — M1990 Unspecified osteoarthritis, unspecified site: Secondary | ICD-10-CM | POA: Diagnosis not present

## 2016-08-10 DIAGNOSIS — I10 Essential (primary) hypertension: Secondary | ICD-10-CM | POA: Diagnosis not present

## 2016-08-10 DIAGNOSIS — E1140 Type 2 diabetes mellitus with diabetic neuropathy, unspecified: Secondary | ICD-10-CM | POA: Diagnosis not present

## 2016-11-09 DIAGNOSIS — J449 Chronic obstructive pulmonary disease, unspecified: Secondary | ICD-10-CM | POA: Diagnosis not present

## 2016-11-09 DIAGNOSIS — E785 Hyperlipidemia, unspecified: Secondary | ICD-10-CM | POA: Diagnosis not present

## 2016-11-09 DIAGNOSIS — Z23 Encounter for immunization: Secondary | ICD-10-CM | POA: Diagnosis not present

## 2016-11-09 DIAGNOSIS — I10 Essential (primary) hypertension: Secondary | ICD-10-CM | POA: Diagnosis not present

## 2016-11-09 DIAGNOSIS — E1140 Type 2 diabetes mellitus with diabetic neuropathy, unspecified: Secondary | ICD-10-CM | POA: Diagnosis not present

## 2016-11-27 DIAGNOSIS — L821 Other seborrheic keratosis: Secondary | ICD-10-CM | POA: Diagnosis not present

## 2016-11-27 DIAGNOSIS — L648 Other androgenic alopecia: Secondary | ICD-10-CM | POA: Diagnosis not present

## 2017-02-12 DIAGNOSIS — N390 Urinary tract infection, site not specified: Secondary | ICD-10-CM | POA: Diagnosis not present

## 2017-02-12 DIAGNOSIS — E1165 Type 2 diabetes mellitus with hyperglycemia: Secondary | ICD-10-CM | POA: Diagnosis not present

## 2017-02-12 DIAGNOSIS — I10 Essential (primary) hypertension: Secondary | ICD-10-CM | POA: Diagnosis not present

## 2017-02-12 DIAGNOSIS — J449 Chronic obstructive pulmonary disease, unspecified: Secondary | ICD-10-CM | POA: Diagnosis not present

## 2017-02-12 DIAGNOSIS — E785 Hyperlipidemia, unspecified: Secondary | ICD-10-CM | POA: Diagnosis not present

## 2017-02-12 DIAGNOSIS — E1140 Type 2 diabetes mellitus with diabetic neuropathy, unspecified: Secondary | ICD-10-CM | POA: Diagnosis not present

## 2017-05-08 DIAGNOSIS — E785 Hyperlipidemia, unspecified: Secondary | ICD-10-CM | POA: Diagnosis not present

## 2017-05-08 DIAGNOSIS — E1140 Type 2 diabetes mellitus with diabetic neuropathy, unspecified: Secondary | ICD-10-CM | POA: Diagnosis not present

## 2017-05-08 DIAGNOSIS — E119 Type 2 diabetes mellitus without complications: Secondary | ICD-10-CM | POA: Diagnosis not present

## 2017-05-08 DIAGNOSIS — R609 Edema, unspecified: Secondary | ICD-10-CM | POA: Diagnosis not present

## 2017-05-08 DIAGNOSIS — J449 Chronic obstructive pulmonary disease, unspecified: Secondary | ICD-10-CM | POA: Diagnosis not present

## 2017-05-08 DIAGNOSIS — N390 Urinary tract infection, site not specified: Secondary | ICD-10-CM | POA: Diagnosis not present

## 2017-05-08 DIAGNOSIS — Z1331 Encounter for screening for depression: Secondary | ICD-10-CM | POA: Diagnosis not present

## 2017-05-08 DIAGNOSIS — Z1389 Encounter for screening for other disorder: Secondary | ICD-10-CM | POA: Diagnosis not present

## 2017-05-08 DIAGNOSIS — I10 Essential (primary) hypertension: Secondary | ICD-10-CM | POA: Diagnosis not present

## 2017-08-27 DIAGNOSIS — R609 Edema, unspecified: Secondary | ICD-10-CM | POA: Diagnosis not present

## 2017-08-27 DIAGNOSIS — J449 Chronic obstructive pulmonary disease, unspecified: Secondary | ICD-10-CM | POA: Diagnosis not present

## 2017-08-27 DIAGNOSIS — I10 Essential (primary) hypertension: Secondary | ICD-10-CM | POA: Diagnosis not present

## 2017-08-27 DIAGNOSIS — E1140 Type 2 diabetes mellitus with diabetic neuropathy, unspecified: Secondary | ICD-10-CM | POA: Diagnosis not present

## 2017-11-05 DIAGNOSIS — J449 Chronic obstructive pulmonary disease, unspecified: Secondary | ICD-10-CM | POA: Diagnosis not present

## 2017-11-05 DIAGNOSIS — M1990 Unspecified osteoarthritis, unspecified site: Secondary | ICD-10-CM | POA: Diagnosis not present

## 2017-11-05 DIAGNOSIS — I10 Essential (primary) hypertension: Secondary | ICD-10-CM | POA: Diagnosis not present

## 2017-11-05 DIAGNOSIS — E1140 Type 2 diabetes mellitus with diabetic neuropathy, unspecified: Secondary | ICD-10-CM | POA: Diagnosis not present

## 2018-02-04 DIAGNOSIS — Z23 Encounter for immunization: Secondary | ICD-10-CM | POA: Diagnosis not present

## 2018-02-04 DIAGNOSIS — J449 Chronic obstructive pulmonary disease, unspecified: Secondary | ICD-10-CM | POA: Diagnosis not present

## 2018-02-04 DIAGNOSIS — I10 Essential (primary) hypertension: Secondary | ICD-10-CM | POA: Diagnosis not present

## 2018-02-04 DIAGNOSIS — E119 Type 2 diabetes mellitus without complications: Secondary | ICD-10-CM | POA: Diagnosis not present

## 2018-02-05 DIAGNOSIS — R609 Edema, unspecified: Secondary | ICD-10-CM | POA: Diagnosis not present

## 2018-02-05 DIAGNOSIS — E785 Hyperlipidemia, unspecified: Secondary | ICD-10-CM | POA: Diagnosis not present

## 2018-02-05 DIAGNOSIS — M1990 Unspecified osteoarthritis, unspecified site: Secondary | ICD-10-CM | POA: Diagnosis not present

## 2018-02-05 DIAGNOSIS — E1140 Type 2 diabetes mellitus with diabetic neuropathy, unspecified: Secondary | ICD-10-CM | POA: Diagnosis not present

## 2018-02-05 DIAGNOSIS — J449 Chronic obstructive pulmonary disease, unspecified: Secondary | ICD-10-CM | POA: Diagnosis not present

## 2018-02-05 DIAGNOSIS — E119 Type 2 diabetes mellitus without complications: Secondary | ICD-10-CM | POA: Diagnosis not present

## 2018-02-05 DIAGNOSIS — I10 Essential (primary) hypertension: Secondary | ICD-10-CM | POA: Diagnosis not present

## 2018-05-20 DIAGNOSIS — I10 Essential (primary) hypertension: Secondary | ICD-10-CM | POA: Diagnosis not present

## 2018-05-20 DIAGNOSIS — E1140 Type 2 diabetes mellitus with diabetic neuropathy, unspecified: Secondary | ICD-10-CM | POA: Diagnosis not present

## 2018-05-20 DIAGNOSIS — J449 Chronic obstructive pulmonary disease, unspecified: Secondary | ICD-10-CM | POA: Diagnosis not present

## 2018-05-20 DIAGNOSIS — M1990 Unspecified osteoarthritis, unspecified site: Secondary | ICD-10-CM | POA: Diagnosis not present

## 2018-08-23 DIAGNOSIS — Z1389 Encounter for screening for other disorder: Secondary | ICD-10-CM | POA: Diagnosis not present

## 2018-08-23 DIAGNOSIS — J449 Chronic obstructive pulmonary disease, unspecified: Secondary | ICD-10-CM | POA: Diagnosis not present

## 2018-08-23 DIAGNOSIS — I10 Essential (primary) hypertension: Secondary | ICD-10-CM | POA: Diagnosis not present

## 2018-08-23 DIAGNOSIS — E1140 Type 2 diabetes mellitus with diabetic neuropathy, unspecified: Secondary | ICD-10-CM | POA: Diagnosis not present

## 2018-08-26 DIAGNOSIS — E1140 Type 2 diabetes mellitus with diabetic neuropathy, unspecified: Secondary | ICD-10-CM | POA: Diagnosis not present

## 2018-08-26 DIAGNOSIS — E785 Hyperlipidemia, unspecified: Secondary | ICD-10-CM | POA: Diagnosis not present

## 2018-08-26 DIAGNOSIS — I10 Essential (primary) hypertension: Secondary | ICD-10-CM | POA: Diagnosis not present

## 2018-09-22 DIAGNOSIS — I10 Essential (primary) hypertension: Secondary | ICD-10-CM | POA: Diagnosis not present

## 2018-09-22 DIAGNOSIS — E119 Type 2 diabetes mellitus without complications: Secondary | ICD-10-CM | POA: Diagnosis not present

## 2018-09-22 DIAGNOSIS — J449 Chronic obstructive pulmonary disease, unspecified: Secondary | ICD-10-CM | POA: Diagnosis not present

## 2018-10-23 DIAGNOSIS — I10 Essential (primary) hypertension: Secondary | ICD-10-CM | POA: Diagnosis not present

## 2018-10-23 DIAGNOSIS — E1140 Type 2 diabetes mellitus with diabetic neuropathy, unspecified: Secondary | ICD-10-CM | POA: Diagnosis not present

## 2018-11-23 DIAGNOSIS — E1140 Type 2 diabetes mellitus with diabetic neuropathy, unspecified: Secondary | ICD-10-CM | POA: Diagnosis not present

## 2018-11-23 DIAGNOSIS — I10 Essential (primary) hypertension: Secondary | ICD-10-CM | POA: Diagnosis not present

## 2018-11-28 DIAGNOSIS — Z23 Encounter for immunization: Secondary | ICD-10-CM | POA: Diagnosis not present

## 2018-12-23 DIAGNOSIS — I10 Essential (primary) hypertension: Secondary | ICD-10-CM | POA: Diagnosis not present

## 2018-12-23 DIAGNOSIS — E1140 Type 2 diabetes mellitus with diabetic neuropathy, unspecified: Secondary | ICD-10-CM | POA: Diagnosis not present

## 2018-12-25 ENCOUNTER — Other Ambulatory Visit: Payer: Self-pay

## 2018-12-25 NOTE — Patient Outreach (Signed)
[facility]) Care Management  12/25/2018  [name] [birth year] [id number]   Medication Adherence call to [name] [employer] spoke with patient she is past due on Simvastatin 20 mg,patient explain she takes 1 tablet daily and has enough for a week,patient ask if we can call [facility] to order it. Pamala Hurry will have it ready for patient to pick up.[name] is showing past due under [name].   [facility] Direct Dial [phone]  Fax [phone] [email or web address]

## 2019-01-23 DIAGNOSIS — I10 Essential (primary) hypertension: Secondary | ICD-10-CM | POA: Diagnosis not present

## 2019-01-23 DIAGNOSIS — E119 Type 2 diabetes mellitus without complications: Secondary | ICD-10-CM | POA: Diagnosis not present

## 2019-02-18 DIAGNOSIS — E1140 Type 2 diabetes mellitus with diabetic neuropathy, unspecified: Secondary | ICD-10-CM | POA: Diagnosis not present

## 2019-02-18 DIAGNOSIS — I10 Essential (primary) hypertension: Secondary | ICD-10-CM | POA: Diagnosis not present

## 2019-02-18 DIAGNOSIS — M1990 Unspecified osteoarthritis, unspecified site: Secondary | ICD-10-CM | POA: Diagnosis not present

## 2019-02-18 DIAGNOSIS — J449 Chronic obstructive pulmonary disease, unspecified: Secondary | ICD-10-CM | POA: Diagnosis not present

## 2019-03-21 DIAGNOSIS — I10 Essential (primary) hypertension: Secondary | ICD-10-CM | POA: Diagnosis not present

## 2019-03-21 DIAGNOSIS — E119 Type 2 diabetes mellitus without complications: Secondary | ICD-10-CM | POA: Diagnosis not present

## 2019-04-21 DIAGNOSIS — E119 Type 2 diabetes mellitus without complications: Secondary | ICD-10-CM | POA: Diagnosis not present

## 2019-04-21 DIAGNOSIS — I10 Essential (primary) hypertension: Secondary | ICD-10-CM | POA: Diagnosis not present

## 2019-05-16 DIAGNOSIS — E119 Type 2 diabetes mellitus without complications: Secondary | ICD-10-CM | POA: Diagnosis not present

## 2019-05-16 DIAGNOSIS — J449 Chronic obstructive pulmonary disease, unspecified: Secondary | ICD-10-CM | POA: Diagnosis not present

## 2019-05-16 DIAGNOSIS — Z0001 Encounter for general adult medical examination with abnormal findings: Secondary | ICD-10-CM | POA: Diagnosis not present

## 2019-05-16 DIAGNOSIS — Z1389 Encounter for screening for other disorder: Secondary | ICD-10-CM | POA: Diagnosis not present

## 2019-05-20 DIAGNOSIS — E785 Hyperlipidemia, unspecified: Secondary | ICD-10-CM | POA: Diagnosis not present

## 2019-05-20 DIAGNOSIS — E119 Type 2 diabetes mellitus without complications: Secondary | ICD-10-CM | POA: Diagnosis not present

## 2019-05-20 DIAGNOSIS — M1990 Unspecified osteoarthritis, unspecified site: Secondary | ICD-10-CM | POA: Diagnosis not present

## 2019-05-20 DIAGNOSIS — J449 Chronic obstructive pulmonary disease, unspecified: Secondary | ICD-10-CM | POA: Diagnosis not present

## 2019-05-20 DIAGNOSIS — I10 Essential (primary) hypertension: Secondary | ICD-10-CM | POA: Diagnosis not present

## 2019-05-20 DIAGNOSIS — R609 Edema, unspecified: Secondary | ICD-10-CM | POA: Diagnosis not present

## 2019-06-16 DIAGNOSIS — I10 Essential (primary) hypertension: Secondary | ICD-10-CM | POA: Diagnosis not present

## 2019-06-16 DIAGNOSIS — E119 Type 2 diabetes mellitus without complications: Secondary | ICD-10-CM | POA: Diagnosis not present

## 2019-07-16 DIAGNOSIS — E119 Type 2 diabetes mellitus without complications: Secondary | ICD-10-CM | POA: Diagnosis not present

## 2019-07-16 DIAGNOSIS — I10 Essential (primary) hypertension: Secondary | ICD-10-CM | POA: Diagnosis not present

## 2019-09-23 ENCOUNTER — Encounter: Payer: Self-pay | Admitting: Emergency Medicine

## 2019-09-23 ENCOUNTER — Ambulatory Visit
Admission: EM | Admit: 2019-09-23 | Discharge: 2019-09-23 | Disposition: A | Discharge status: Home / Self Care | Payer: Medicare Other

## 2019-09-23 DIAGNOSIS — L97511 Non-pressure chronic ulcer of other part of right foot limited to breakdown of skin: Secondary | ICD-10-CM

## 2019-09-23 MED ORDER — HYDROCORTISONE 1 % EX CREA: TOPICAL_CREAM | CUTANEOUS | 0 refills | Status: DC

## 2019-09-23 MED ORDER — CEPHALEXIN 500 MG PO CAPS: 500.0000 mg | ORAL_CAPSULE | Freq: Four times a day (QID) | ORAL | 0 refills | Status: DC

## 2019-09-23 NOTE — ED Triage Notes (Signed)
PT rolled out of the bed yesterday, hit RT great toe and now has a wound to that area.  Pt is concerned d/t she is diabetic.

## 2019-09-23 NOTE — ED Provider Notes (Signed)
[facility]    CSN: [id number] Arrival date & time: 09/23/19  1143      History   Chief Complaint Chief Complaint  Patient presents with  . Toe Injury    HPI [name] is a 86 y.o. female.   Patient is an 86 year old female who presents today with right great toe ulcer.  This occurred yesterday when she hit her toe getting out of bed.  Since has been more swollen, painful.  She is concerned due to history of diabetes.  Otherwise she denies any other issues.  Mild drainage.  No fevers  Patient also has 2 welts to right, mid back area and left arm.  Described as very itchy.  Reporting she believes she was bit by something.  ROS per HPI      Past Medical History:  Diagnosis Date  . Acid reflux   . Arthritis   . Burning with urination 11/27/2013  . Coronary artery disease   . Diabetes mellitus   . Glaucoma (increased eye pressure)   . Hematuria 11/27/2013  . Hypertension   . Superficial fungus infection of skin 11/27/2013    Patient Active Problem List   Diagnosis Date Noted  . Hematuria 11/27/2013  . Burning with urination 11/27/2013  . Superficial fungus infection of skin 11/27/2013  . OA (osteoarthritis) of knee 07/24/2012    Past Surgical History:  Procedure Laterality Date  . ABDOMINAL HYSTERECTOMY    . CHOLECYSTECTOMY      OB History    Gravida  2   Para  2   Term      Preterm      AB      Living  2     SAB      TAB      Ectopic      Multiple      Live Births               Home Medications    Prior to Admission medications   Medication Sig Start Date End Date Taking? Authorizing Provider  furosemide (LASIX) 40 MG tablet Take 40 mg by mouth 3 (three) times daily.     Yes [provider]  gabapentin (NEURONTIN) 600 MG tablet Take 600 mg by mouth 3 (three) times daily.     Yes [provider]  lisinopril (PRINIVIL,ZESTRIL) 20 MG tablet Take 20 mg by mouth daily.     Yes [provider]   metFORMIN (GLUCOPHAGE) 500 MG tablet 500 mg 2 (two) times daily with a meal.  11/06/13  Yes [provider]  potassium chloride (KLOR-CON) 20 MEQ packet Take 20 mEq by mouth 2 (two) times daily.   Yes [provider]  simvastatin (ZOCOR) 20 MG tablet Take 20 mg by mouth daily.    Yes [provider]  cephALEXin (KEFLEX) 500 MG capsule Take 1 capsule (500 mg total) by mouth 4 (four) times daily. 09/23/19   [name], NP  glyBURIDE micronized (GLYNASE) 3 MG tablet Take 3 mg by mouth 2 (two) times daily before a meal.      [provider]  hydrocortisone cream 1 % Apply to affected area 2 times daily 09/23/19   [name], NP  nystatin (MYCOSTATIN/NYSTOP) 100000 UNIT/GM POWD Use 2-3 x daily to affected area prn Patient not taking: Reported on 09/17/2014 11/27/13   [name], NP  verapamil (CALAN-SR) 120 MG CR tablet Take 120 mg by mouth daily.  [provider]    Family History Family History  Problem Relation Age of Onset  . Heart disease Father   . Kidney disease Father        kidney failure  . Diabetes Brother   . Other Sister        cardiac arrest  . Cancer Sister        lung  . Diabetes Sister   . Cancer Brother        lung  . Alcohol abuse Brother   . Alcohol abuse Brother     Social History Social History   Tobacco Use  . Smoking status: Never Smoker  . Smokeless tobacco: Never Used  Substance Use Topics  . Alcohol use: No  . Drug use: No     Allergies   Patient has no known allergies.   Review of Systems Review of Systems   Physical Exam Triage Vital Signs ED Triage Vitals  Enc Vitals Group     BP 09/23/19 1213 108/71     Pulse Rate 09/23/19 1213 78     Resp 09/23/19 1213 17     Temp 09/23/19 1213 98.7 F (37.1 C)     Temp Source 09/23/19 1213 Oral     SpO2 09/23/19 1213 98 %     Weight 09/23/19 1210 192 lb (87.1 kg)     Height 09/23/19 1210 5' (1.524 m)     Head Circumference --       Peak Flow --      Pain Score 09/23/19 1210 0     Pain Loc --      Pain Edu? --      Excl. in GC? --    No data found.  Updated Vital Signs BP 108/71 (BP Location: Right Arm)   Pulse 78   Temp 98.7 F (37.1 C) (Oral)   Resp 17   Ht 5' (1.524 m)   Wt 192 lb (87.1 kg)   SpO2 98%   BMI 37.50 kg/m   Visual Acuity Right Eye Distance:   Left Eye Distance:   Bilateral Distance:    Right Eye Near:   Left Eye Near:    Bilateral Near:     Physical Exam Vitals and nursing note reviewed.  Constitutional:      General: She is not in acute distress.    Appearance: Normal appearance. She is not ill-appearing, toxic-appearing or diaphoretic.  HENT:     Head: Normocephalic.     Nose: Nose normal.  Eyes:     Conjunctiva/sclera: Conjunctivae normal.  Pulmonary:     Effort: Pulmonary effort is normal.  Musculoskeletal:        General: Normal range of motion.     Cervical back: Normal range of motion.       Feet:  Feet:     Comments: Ulcer with swelling and mild drainage. Tender to touch.  Skin:    General: Skin is warm and dry.     Findings: Rash present.     Comments: 2 welts to right mid back.   Neurological:     Mental Status: She is alert.  Psychiatric:        Mood and Affect: Mood normal.      UC Treatments / Results  Labs (all labs ordered are listed, but only abnormal results are displayed) Labs Reviewed - No data to display  EKG   Radiology No results found.  Procedures Procedures (including critical care time)  Medications Ordered in  UC Medications - No data to display  Initial Impression / Assessment and Plan / UC Course  I have reviewed the triage vital signs and the nursing notes.  Pertinent labs & imaging results that were available during my care of the patient were reviewed by me and considered in my medical decision making (see chart for details).     Skin ulcer of the toe of the right foot due to injury Patient concerned due to her  history of diabetes and concern about infection. We will go ahead and cover her prophylactically for infection with Keflex.  Recommend keep the wound clean and dry and covered.  Follow-up for any continued or worsening problems  Bug bites Some sort of insect bite We will do hydrocortisone cream for itching and inflammation Follow up as needed for continued or worsening symptoms  Final Clinical Impressions(s) / UC Diagnoses   Final diagnoses:  Skin ulcer of toe of right foot, limited to breakdown of skin [facility])     Discharge Instructions     Keflex for antibiotic coverage for the toe. Take 4 times a day for 7 days.  Hydrocortisone cream for the bug bites Follow up as needed for continued or worsening symptoms     ED Prescriptions    Medication Sig Dispense Auth. Provider   cephALEXin (KEFLEX) 500 MG capsule Take 1 capsule (500 mg total) by mouth 4 (four) times daily. 28 capsule Abilene Mcphee A, NP   hydrocortisone cream 1 % Apply to affected area 2 times daily 15 g Jourdyn Ferrin A, NP     PDMP not reviewed this encounter.   [name], NP 09/23/19 1601

## 2019-09-23 NOTE — Discharge Instructions (Addendum)
Keflex for antibiotic coverage for the toe. Take 4 times a day for 7 days.  Hydrocortisone cream for the bug bites Follow up as needed for continued or worsening symptoms

## 2020-09-03 ENCOUNTER — Other Ambulatory Visit: Payer: Self-pay

## 2020-09-03 ENCOUNTER — Encounter (HOSPITAL_COMMUNITY): Payer: Self-pay | Admitting: Emergency Medicine

## 2020-09-03 ENCOUNTER — Emergency Department (HOSPITAL_COMMUNITY)
Admission: EM | Admit: 2020-09-03 | Discharge: 2020-09-03 | Disposition: A | Discharge status: Home / Self Care | Payer: Medicare Other | Attending: Emergency Medicine | Admitting: Emergency Medicine

## 2020-09-03 DIAGNOSIS — I2510 Atherosclerotic heart disease of native coronary artery without angina pectoris: Secondary | ICD-10-CM | POA: Insufficient documentation

## 2020-09-03 DIAGNOSIS — Z7984 Long term (current) use of oral hypoglycemic drugs: Secondary | ICD-10-CM | POA: Insufficient documentation

## 2020-09-03 DIAGNOSIS — R220 Localized swelling, mass and lump, head: Secondary | ICD-10-CM | POA: Insufficient documentation

## 2020-09-03 DIAGNOSIS — R0602 Shortness of breath: Secondary | ICD-10-CM | POA: Insufficient documentation

## 2020-09-03 DIAGNOSIS — R1310 Dysphagia, unspecified: Secondary | ICD-10-CM | POA: Diagnosis not present

## 2020-09-03 DIAGNOSIS — Z79899 Other long term (current) drug therapy: Secondary | ICD-10-CM | POA: Diagnosis not present

## 2020-09-03 DIAGNOSIS — E119 Type 2 diabetes mellitus without complications: Secondary | ICD-10-CM | POA: Insufficient documentation

## 2020-09-03 DIAGNOSIS — I10 Essential (primary) hypertension: Secondary | ICD-10-CM | POA: Diagnosis not present

## 2020-09-03 LAB — CBG MONITORING, ED: Glucose-Capillary: 93 mg/dL (ref 70–99)

## 2020-09-03 MED ORDER — LIDOCAINE VISCOUS HCL 2 % MT SOLN
15.0000 mL | Freq: Once | OROMUCOSAL | Status: AC
  Administered 2020-09-03: 15 mL via OROMUCOSAL
  Filled 2020-09-03: qty 15

## 2020-09-03 NOTE — Discharge Instructions (Signed)
You came to the emergency department today to be evaluated for your tongue swelling.  Your physical exam was very reassuring and did not show any swelling of your tongue at this time.  There is concern that due to your symptoms you may have had a reaction to the lisinopril medication you are taking.  Stop taking your lisinopril medication.  Follow-up with your primary care provider for further management of your blood pressure.  Get help right away if: You have severe swelling of your mouth, tongue, or lips. Your swelling is worsening. You have trouble breathing, swallowing, or talking. You have chest pain, dizziness or light-headedness, or you pass out.

## 2020-09-03 NOTE — ED Triage Notes (Signed)
Pt c/o tongue swelling last night, states it is better today. Tongue appears normal in triage, pt speaking clear

## 2020-09-03 NOTE — ED Provider Notes (Signed)
[facility] EMERGENCY DEPARTMENT Provider Note   CSN: [id number] Arrival date & time: 09/03/20  1537     History Chief Complaint  Patient presents with   Oral Swelling    [name] is a 87 y.o. female with medical history as noted below.  Patient presents with a chief complaint of tongue swelling.  Patient reports that her tongue was swollen earlier this morning at approximately 0030.  Patient reports that only her tongue was swollen.  Patient does endorse that she had difficulty swallowing and breathing at that time.  Patient reports that she took a mixture of vinegar and lemon and gargled it.  Swelling gradually improved throughout the rest of the night and day.  Patient denies any tongue swelling at this time.  Patient denies any known food allergies.  Patient denies trying any new foods yesterday.  Patient is currently on lisinopril medication.  Patient last took this medication earlier this morning.  Patient denies any associated rash or hives.  HPI     Past Medical History:  Diagnosis Date   Acid reflux    Arthritis    Burning with urination 11/27/2013   Coronary artery disease    Diabetes mellitus    Glaucoma (increased eye pressure)    Hematuria 11/27/2013   Hypertension    Superficial fungus infection of skin 11/27/2013    Patient Active Problem List   Diagnosis Date Noted   Hematuria 11/27/2013   Burning with urination 11/27/2013   Superficial fungus infection of skin 11/27/2013   OA (osteoarthritis) of knee 07/24/2012    Past Surgical History:  Procedure Laterality Date   ABDOMINAL HYSTERECTOMY     CHOLECYSTECTOMY       OB History     Gravida  2   Para  2   Term      Preterm      AB      Living  2      SAB      IAB      Ectopic      Multiple      Live Births              Family History  Problem Relation Age of Onset   Heart disease Father    Kidney disease Father        kidney failure   Diabetes Brother    Other Sister         cardiac arrest   Cancer Sister        lung   Diabetes Sister    Cancer Brother        lung   Alcohol abuse Brother    Alcohol abuse Brother     Social History   Tobacco Use   Smoking status: Never   Smokeless tobacco: Never  Substance Use Topics   Alcohol use: No   Drug use: No    Home Medications Prior to Admission medications   Medication Sig Start Date End Date Taking? Authorizing Provider  cephALEXin (KEFLEX) 500 MG capsule Take 1 capsule (500 mg total) by mouth 4 (four) times daily. 09/23/19   [name], NP  furosemide (LASIX) 40 MG tablet Take 40 mg by mouth 3 (three) times daily.      [provider]  gabapentin (NEURONTIN) 600 MG tablet Take 600 mg by mouth 3 (three) times daily.      [provider]  glyBURIDE micronized (GLYNASE) 3 MG tablet Take 3 mg by mouth 2 (  two) times daily before a meal.      [provider]  hydrocortisone cream 1 % Apply to affected area 2 times daily 09/23/19   [name], NP  lisinopril (PRINIVIL,ZESTRIL) 20 MG tablet Take 20 mg by mouth daily.      [provider]  metFORMIN (GLUCOPHAGE) 500 MG tablet 500 mg 2 (two) times daily with a meal.  11/06/13   [provider]  nystatin (MYCOSTATIN/NYSTOP) 100000 UNIT/GM POWD Use 2-3 x daily to affected area prn Patient not taking: Reported on 09/17/2014 11/27/13   [name], NP  potassium chloride (KLOR-CON) 20 MEQ packet Take 20 mEq by mouth 2 (two) times daily.    [provider]  simvastatin (ZOCOR) 20 MG tablet Take 20 mg by mouth daily.     [provider]  verapamil (CALAN-SR) 120 MG CR tablet Take 120 mg by mouth daily.      [provider]    Allergies    Patient has no known allergies.  Review of Systems   Review of Systems  Constitutional:  Negative for chills and fever.  HENT:  Positive for drooling and trouble swallowing. Negative for facial swelling and sore throat.   Respiratory:   Positive for shortness of breath.   Gastrointestinal:  Negative for abdominal pain, diarrhea, nausea and vomiting.  Musculoskeletal:  Negative for neck pain and neck stiffness.  Skin:  Negative for color change, pallor, rash and wound.   Physical Exam Updated Vital Signs BP (!) 125/47   Pulse 90   Temp 98.6 F (37 C)   Resp 18   Ht 5' (1.524 m)   Wt 85.7 kg   SpO2 100%   BMI 36.91 kg/m   Physical Exam Vitals and nursing note reviewed.  Constitutional:      General: She is not in acute distress.    Appearance: She is not ill-appearing, toxic-appearing or diaphoretic.  HENT:     Head: Normocephalic and atraumatic. No raccoon eyes, abrasion, contusion, masses, right periorbital erythema, left periorbital erythema or laceration.     Jaw: No trismus, tenderness, swelling, pain on movement or malocclusion.     Salivary Glands: Right salivary gland is not diffusely enlarged or tender. Left salivary gland is not diffusely enlarged or tender.     Mouth/Throat:     Mouth: Mucous membranes are moist. No injury, lacerations, oral lesions or angioedema.     Dentition: Has dentures.     Pharynx: Oropharynx is clear. Uvula midline. No pharyngeal swelling, oropharyngeal exudate, posterior oropharyngeal erythema or uvula swelling.     Tonsils: No tonsillar exudate.     Comments: top dentures present, no bottom dentures.  No teeth.  No swelling to oral mucosa. Eyes:     General: No scleral icterus.       Right eye: No discharge.        Left eye: No discharge.  Cardiovascular:     Rate and Rhythm: Normal rate.  Pulmonary:     Effort: Pulmonary effort is normal.  Musculoskeletal:     Cervical back: Full passive range of motion without pain, normal range of motion and neck supple. No edema, erythema, signs of trauma, rigidity, torticollis or crepitus. No pain with movement, spinous process tenderness or muscular tenderness. Normal range of motion.  Skin:    General: Skin is warm and dry.      Comments: Is a rash noted to patient's face, bilateral upper arms, abdomen or back  Neurological:  General: No focal deficit present.     Mental Status: She is alert.  Psychiatric:        Behavior: Behavior is cooperative.    ED Results / Procedures / Treatments   Labs (all labs ordered are listed, but only abnormal results are displayed) Labs Reviewed - No data to display  EKG None  Radiology No results found.  Procedures Procedures   Medications Ordered in ED Medications - No data to display  ED Course  I have reviewed the triage vital signs and the nursing notes.  Pertinent labs & imaging results that were available during my care of the patient were reviewed by me and considered in my medical decision making (see chart for details).    MDM Rules/Calculators/A&P                          Alert 87 year old female no acute stress, nontoxic-appearing.  Patient presents emerged from with complaint of tongue swelling earlier this morning at 0030.  Patient reports that her symptoms gradually improved over this time.  Patient denies any symptoms at present.  Physical exam patient has no signs of peritonsillar abscess, Ludwig angina, dental abscess or angioedema.  Patient is currently taking lisinopril medication.  Concern for possible angioedema.  Will advise patient to stop this medication and follow-up with primary care provider for further management of hypertension.  Patient is a diabetic and reports that she is concerned about her blood sugar.  We will check patient's blood sugar prior to discharge. CBG 93.   Patient was discussed with and evaluated by Dr. Particia Nearing.   Final Clinical Impression(s) / ED Diagnoses Final diagnoses:  Tongue swelling    Rx / DC Orders ED Discharge Orders     None        Berneice Heinrich 09/03/20 2318    Jacalyn Lefevre, MD 09/03/20 2349

## 2020-09-06 ENCOUNTER — Encounter (HOSPITAL_COMMUNITY): Payer: Self-pay | Admitting: Emergency Medicine

## 2020-12-24 ENCOUNTER — Emergency Department (HOSPITAL_COMMUNITY): Payer: Medicare Other

## 2020-12-24 ENCOUNTER — Emergency Department (HOSPITAL_COMMUNITY)
Admission: EM | Admit: 2020-12-24 | Discharge: 2020-12-24 | Disposition: A | Discharge status: Home / Self Care | Payer: Medicare Other | Attending: Emergency Medicine | Admitting: Emergency Medicine

## 2020-12-24 ENCOUNTER — Encounter (HOSPITAL_COMMUNITY): Payer: Self-pay | Admitting: Emergency Medicine

## 2020-12-24 DIAGNOSIS — Z7984 Long term (current) use of oral hypoglycemic drugs: Secondary | ICD-10-CM | POA: Insufficient documentation

## 2020-12-24 DIAGNOSIS — I2510 Atherosclerotic heart disease of native coronary artery without angina pectoris: Secondary | ICD-10-CM | POA: Insufficient documentation

## 2020-12-24 DIAGNOSIS — E119 Type 2 diabetes mellitus without complications: Secondary | ICD-10-CM | POA: Diagnosis not present

## 2020-12-24 DIAGNOSIS — Z79899 Other long term (current) drug therapy: Secondary | ICD-10-CM | POA: Insufficient documentation

## 2020-12-24 DIAGNOSIS — I10 Essential (primary) hypertension: Secondary | ICD-10-CM | POA: Insufficient documentation

## 2020-12-24 DIAGNOSIS — L03211 Cellulitis of face: Secondary | ICD-10-CM | POA: Insufficient documentation

## 2020-12-24 DIAGNOSIS — R220 Localized swelling, mass and lump, head: Secondary | ICD-10-CM | POA: Diagnosis present

## 2020-12-24 LAB — CBC WITH DIFFERENTIAL/PLATELET
Abs Immature Granulocytes: 0.04 10*3/uL (ref 0.00–0.07)
Basophils Absolute: 0.1 10*3/uL (ref 0.0–0.1)
Basophils Relative: 1 %
Eosinophils Absolute: 0.1 10*3/uL (ref 0.0–0.5)
Eosinophils Relative: 1 %
HCT: 37.1 % (ref 36.0–46.0)
Hemoglobin: 11.6 g/dL — ABNORMAL LOW (ref 12.0–15.0)
Immature Granulocytes: 1 %
Lymphocytes Relative: 19 %
Lymphs Abs: 1.4 10*3/uL (ref 0.7–4.0)
MCH: 26.4 pg (ref 26.0–34.0)
MCHC: 31.3 g/dL (ref 30.0–36.0)
MCV: 84.3 fL (ref 80.0–100.0)
Monocytes Absolute: 0.6 10*3/uL (ref 0.1–1.0)
Monocytes Relative: 9 %
Neutro Abs: 5 10*3/uL (ref 1.7–7.7)
Neutrophils Relative %: 69 %
Platelets: 255 10*3/uL (ref 150–400)
RBC: 4.4 MIL/uL (ref 3.87–5.11)
RDW: 13.6 % (ref 11.5–15.5)
WBC: 7.1 10*3/uL (ref 4.0–10.5)
nRBC: 0 % (ref 0.0–0.2)

## 2020-12-24 LAB — BASIC METABOLIC PANEL
Anion gap: 9 (ref 5–15)
BUN: 24 mg/dL — ABNORMAL HIGH (ref 8–23)
CO2: 29 mmol/L (ref 22–32)
Calcium: 9.9 mg/dL (ref 8.9–10.3)
Chloride: 100 mmol/L (ref 98–111)
Creatinine, Ser: 0.76 mg/dL (ref 0.44–1.00)
GFR, Estimated: >60 mL/min (ref >60)
Glucose, Bld: 110 mg/dL — ABNORMAL HIGH (ref 70–99)
Potassium: 4.2 mmol/L (ref 3.5–5.1)
Sodium: 138 mmol/L (ref 135–145)

## 2020-12-24 MED ORDER — SODIUM CHLORIDE 0.9 % IV BOLUS
500.0000 mL | Freq: Once | INTRAVENOUS | Status: AC
  Administered 2020-12-24: 500 mL via INTRAVENOUS

## 2020-12-24 MED ORDER — CEPHALEXIN 500 MG PO CAPS: 500.0000 mg | ORAL_CAPSULE | Freq: Four times a day (QID) | ORAL | 0 refills | Status: DC

## 2020-12-24 MED ORDER — CEPHALEXIN 500 MG PO CAPS
500.0000 mg | ORAL_CAPSULE | Freq: Once | ORAL | Status: AC
  Administered 2020-12-24: 500 mg via ORAL
  Filled 2020-12-24: qty 1

## 2020-12-24 MED ORDER — IOHEXOL 300 MG/ML  SOLN
75.0000 mL | Freq: Once | INTRAMUSCULAR | Status: AC | PRN
  Administered 2020-12-24: 75 mL via INTRAVENOUS

## 2020-12-24 NOTE — ED Notes (Signed)
Patient transported to CT 

## 2020-12-24 NOTE — Discharge Instructions (Addendum)
You have cellulitis to your face.  Please take the antibiotics that are prescribed.  Return to the ER if your symptoms are getting worse despite taking the antibiotics.

## 2020-12-24 NOTE — ED Triage Notes (Signed)
Pt states she started having swelling in the right side of her face yesterday. Pt states that her face has gotten tighter since yesterday and that she is having a hard time swallowing. Pt has not started any new medications and denies any allergies.

## 2020-12-24 NOTE — ED Provider Notes (Signed)
[facility] EMERGENCY DEPARTMENT Provider Note   CSN: [id number] Arrival date & time: 12/24/20  1005     History Chief Complaint  Patient presents with   Facial Swelling    Roberta D Solis is a 87 y.o. female.  HPI    87 year old female comes in with chief complaint of facial swelling.  Patient has history of diabetes, hypertension and she was recently seen in the ER with tongue swelling which was thought to be secondary to lisinopril.  Patient states that she started noticing some swelling to her face yesterday.  The swelling is on the right side.  It feels tight in that area and she has some discomfort with swallowing.  She denies any difficulty breathing.  There is some discomfort over her dentition on the right side as well.  Of note, patient's PCP has restarted her back on lisinopril.  Past Medical History:  Diagnosis Date   Acid reflux    Arthritis    Burning with urination 11/27/2013   Coronary artery disease    Diabetes mellitus    Glaucoma (increased eye pressure)    Hematuria 11/27/2013   Hypertension    Superficial fungus infection of skin 11/27/2013    Patient Active Problem List   Diagnosis Date Noted   Hematuria 11/27/2013   Burning with urination 11/27/2013   Superficial fungus infection of skin 11/27/2013   OA (osteoarthritis) of knee 07/24/2012    Past Surgical History:  Procedure Laterality Date   ABDOMINAL HYSTERECTOMY     CHOLECYSTECTOMY       OB History     Gravida  2   Para  2   Term  0   Preterm  0   AB  0   Living         SAB  0   IAB  0   Ectopic  0   Multiple      Live Births              Family History  Problem Relation Age of Onset   Heart disease Father    Kidney disease Father        kidney failure   Diabetes Brother    Other Sister        cardiac arrest   Cancer Sister        lung   Diabetes Sister    Cancer Brother        lung   Alcohol abuse Brother    Alcohol abuse Brother     Social  History   Tobacco Use   Smoking status: Never   Smokeless tobacco: Never  Substance Use Topics   Alcohol use: No   Drug use: No    Home Medications Prior to Admission medications   Medication Sig Start Date End Date Taking? Authorizing Provider  ALPRAZolam Prudy Feeler) 0.25 MG tablet Take 0.25 mg by mouth daily as needed for anxiety. 10/01/20  Yes [provider]  cephALEXin (KEFLEX) 500 MG capsule Take 1 capsule (500 mg total) by mouth 4 (four) times daily. 12/24/20  Yes Derwood Kaplan, MD  gabapentin (NEURONTIN) 600 MG tablet Take 600 mg by mouth 3 (three) times daily.     Yes [provider]  lisinopril (PRINIVIL,ZESTRIL) 20 MG tablet Take 20 mg by mouth daily.     Yes [provider]  MELATONIN PO Take 1 tablet by mouth daily as needed (sleep).   Yes [provider]  metFORMIN (GLUCOPHAGE) 500 MG tablet 500 mg  2 (two) times daily with a meal.  11/06/13  Yes [provider]  metolazone (ZAROXOLYN) 2.5 MG tablet Take 2.5 mg by mouth every other day. 11/03/20  Yes [provider]  potassium chloride (KLOR-CON) 20 MEQ packet Take 20 mEq by mouth 2 (two) times daily.   Yes [provider]  simvastatin (ZOCOR) 20 MG tablet Take 20 mg by mouth daily.    Yes [provider]  torsemide (DEMADEX) 20 MG tablet Take 20 mg by mouth daily. 11/03/20  Yes [provider]  verapamil (CALAN-SR) 120 MG CR tablet Take 120 mg by mouth daily.     Yes [provider]  hydrocortisone cream 1 % Apply to affected area 2 times daily Patient not taking: Reported on 12/24/2020 09/23/19   [name], NP  nystatin (MYCOSTATIN/NYSTOP) 100000 UNIT/GM POWD Use 2-3 x daily to affected area prn Patient not taking: No sig reported 11/27/13   [name], NP    Allergies    Patient has no known allergies.  Review of Systems   Review of Systems  Constitutional:  Positive for activity change.  HENT:  Negative for trouble  swallowing and voice change.   Gastrointestinal:  Negative for nausea and vomiting.  Allergic/Immunologic: Negative for immunocompromised state.   Physical Exam Updated Vital Signs BP (!) 142/62   Pulse 92   Temp 99.1 F (37.3 C) (Oral)   Resp 16   Ht 5' (1.524 m)   Wt 81.6 kg   SpO2 100%   BMI 35.15 kg/m   Physical Exam Vitals and nursing note reviewed.  Constitutional:      Appearance: She is well-developed.  HENT:     Head: Atraumatic.     Mouth/Throat:     Comments: R facial swelling, lower mandible area. No teeth in the the upper quadrants. No gingival fluctuance or abscess.  No clear trismus.  Lingual exam did not reveal any significant edema. Cardiovascular:     Rate and Rhythm: Normal rate.  Pulmonary:     Effort: Pulmonary effort is normal.  Musculoskeletal:     Cervical back: Neck supple.  Skin:    General: Skin is warm and dry.  Neurological:     Mental Status: She is alert and oriented to person, place, and time.    ED Results / Procedures / Treatments   Labs (all labs ordered are listed, but only abnormal results are displayed) Labs Reviewed  BASIC METABOLIC PANEL - Abnormal; Notable for the following components:      Result Value   Glucose, Bld 110 (*)    BUN 24 (*)    All other components within normal limits  CBC WITH DIFFERENTIAL/PLATELET - Abnormal; Notable for the following components:   Hemoglobin 11.6 (*)    All other components within normal limits    EKG None  Radiology CT Soft Tissue Neck W Contrast  Result Date: 12/24/2020 CLINICAL DATA:  Right-sided facial swelling, difficulty swallowing EXAM: CT NECK WITH CONTRAST TECHNIQUE: Multidetector CT imaging of the neck was performed using the standard protocol following the bolus administration of intravenous contrast. CONTRAST:  10mL OMNIPAQUE IOHEXOL 300 MG/ML  SOLN COMPARISON:  None. FINDINGS: Pharynx and larynx: The nasal cavity and nasopharynx are normal. The oropharynx and oral  cavity are unremarkable. The hypopharynx and larynx are unremarkable. There is no abnormal soft tissue lesion or fluid collection. The parapharyngeal spaces are clear. Salivary glands: The parotid and submandibular glands are unremarkable. Thyroid: Multiple small nodules are  seen in the thyroid, some of which are calcified, likely not significantly changed since 2013. Lymph nodes: There is no pathologic lymphadenopathy in the neck. Vascular: There is a medialized retropharyngeal course of the bilateral common carotid arteries. There is mild calcified atherosclerotic plaque of the aortic arch. Limited intracranial: The imaged portions of the intracranial compartment are unremarkable. Visualized orbits: Not evaluated. Mastoids and visualized paranasal sinuses: The mastoid air cells are clear. The imaged paranasal sinuses are clear. Skeleton: There is a partially imaged lucent lesion in the left temporal bone, present since 2005. There is mild multilevel degenerative change of the cervical spine. There is no acute osseous abnormality or aggressive osseous lesion. Upper chest: There is a calcified granuloma in the left lung apex. The lung apices are otherwise clear. Other: There is mild skin thickening in the right cheek with underlying inflammatory fat stranding and mild asymmetric thickening of the right platysma muscle. There is no organized fluid collection. IMPRESSION: Skin thickening with underlying fat stranding in the right face/cheek consistent with cellulitis. No evidence of abscess formation. Electronically Signed   By: Lesia Hausen M.D.   On: 12/24/2020 14:32    Procedures Procedures   Medications Ordered in ED Medications  cephALEXin (KEFLEX) capsule 500 mg (has no administration in time range)  sodium chloride 0.9 % bolus 500 mL (0 mLs Intravenous Stopped 12/24/20 1356)  iohexol (OMNIPAQUE) 300 MG/ML solution 75 mL (75 mLs Intravenous Contrast Given 12/24/20 1411)    ED Course  I have reviewed  the triage vital signs and the nursing notes.  Pertinent labs & imaging results that were available during my care of the patient were reviewed by me and considered in my medical decision making (see chart for details).  Clinical Course as of 12/24/20 1508  Fri Dec 24, 2020  1508 CT shows findings consistent with cellulitis.  No dental involvement.  We will start her on Keflex. [AN]    Clinical Course User Index [AN] Derwood Kaplan, MD   MDM Rules/Calculators/A&P                          87 year old comes in with chief complaint of right-sided facial swelling. She recently was seen in the ER with oral swelling that was thought to be because of lisinopril.  Patient is back on lisinopril.  At this time, her oral exams, specifically lingual and lip exam is reassuring.  She is complaining of some discomfort over the right side of her jaw, where there is some swelling as well.  She has no dentition in the upper quadrants and one of the concerns would be that there is an abscess that is spreading.  Other possibility includes lymphadenitis, salivary stone.  Given the awake circumstances around her previous swelling, this time we will proceed with a CT scan to make sure there is no evidence of any cancer either.  Final Clinical Impression(s) / ED Diagnoses Final diagnoses:  Facial cellulitis    Rx / DC Orders ED Discharge Orders          Ordered    cephALEXin (KEFLEX) 500 MG capsule  4 times daily        12/24/20 1507             Derwood Kaplan, MD 12/24/20 [phone]

## 2021-05-29 DIAGNOSIS — E1140 Type 2 diabetes mellitus with diabetic neuropathy, unspecified: Secondary | ICD-10-CM | POA: Diagnosis not present

## 2021-05-29 DIAGNOSIS — I10 Essential (primary) hypertension: Secondary | ICD-10-CM | POA: Diagnosis not present

## 2021-07-06 DIAGNOSIS — E119 Type 2 diabetes mellitus without complications: Secondary | ICD-10-CM | POA: Diagnosis not present

## 2021-07-06 DIAGNOSIS — I10 Essential (primary) hypertension: Secondary | ICD-10-CM | POA: Diagnosis not present

## 2021-07-28 ENCOUNTER — Other Ambulatory Visit: Payer: Self-pay

## 2021-07-28 ENCOUNTER — Emergency Department (HOSPITAL_COMMUNITY): Payer: Medicare Other

## 2021-07-28 ENCOUNTER — Emergency Department (HOSPITAL_COMMUNITY)
Admission: EM | Admit: 2021-07-28 | Discharge: 2021-07-28 | Disposition: A | Discharge status: Home / Self Care | Payer: Medicare Other | Attending: Emergency Medicine | Admitting: Emergency Medicine

## 2021-07-28 ENCOUNTER — Encounter (HOSPITAL_COMMUNITY): Payer: Self-pay | Admitting: *Deleted

## 2021-07-28 DIAGNOSIS — E119 Type 2 diabetes mellitus without complications: Secondary | ICD-10-CM | POA: Diagnosis not present

## 2021-07-28 DIAGNOSIS — M1711 Unilateral primary osteoarthritis, right knee: Secondary | ICD-10-CM | POA: Diagnosis not present

## 2021-07-28 DIAGNOSIS — M25562 Pain in left knee: Secondary | ICD-10-CM | POA: Diagnosis not present

## 2021-07-28 DIAGNOSIS — I2510 Atherosclerotic heart disease of native coronary artery without angina pectoris: Secondary | ICD-10-CM | POA: Diagnosis not present

## 2021-07-28 DIAGNOSIS — R600 Localized edema: Secondary | ICD-10-CM | POA: Insufficient documentation

## 2021-07-28 DIAGNOSIS — M79605 Pain in left leg: Secondary | ICD-10-CM | POA: Diagnosis not present

## 2021-07-28 DIAGNOSIS — Z79899 Other long term (current) drug therapy: Secondary | ICD-10-CM | POA: Diagnosis not present

## 2021-07-28 DIAGNOSIS — M79671 Pain in right foot: Secondary | ICD-10-CM | POA: Diagnosis not present

## 2021-07-28 DIAGNOSIS — Z7984 Long term (current) use of oral hypoglycemic drugs: Secondary | ICD-10-CM | POA: Insufficient documentation

## 2021-07-28 DIAGNOSIS — I10 Essential (primary) hypertension: Secondary | ICD-10-CM | POA: Insufficient documentation

## 2021-07-28 DIAGNOSIS — M25561 Pain in right knee: Secondary | ICD-10-CM | POA: Diagnosis not present

## 2021-07-28 DIAGNOSIS — M7989 Other specified soft tissue disorders: Secondary | ICD-10-CM | POA: Diagnosis not present

## 2021-07-28 DIAGNOSIS — M79672 Pain in left foot: Secondary | ICD-10-CM | POA: Insufficient documentation

## 2021-07-28 DIAGNOSIS — M79604 Pain in right leg: Secondary | ICD-10-CM | POA: Diagnosis not present

## 2021-07-28 DIAGNOSIS — M1712 Unilateral primary osteoarthritis, left knee: Secondary | ICD-10-CM | POA: Diagnosis not present

## 2021-07-28 LAB — CBC WITH DIFFERENTIAL/PLATELET
Abs Immature Granulocytes: 0.03 10*3/uL (ref 0.00–0.07)
Basophils Absolute: 0.1 10*3/uL (ref 0.0–0.1)
Basophils Relative: 1 %
Eosinophils Absolute: 0.2 10*3/uL (ref 0.0–0.5)
Eosinophils Relative: 3 %
HCT: 35.5 % — ABNORMAL LOW (ref 36.0–46.0)
Hemoglobin: 11.1 g/dL — ABNORMAL LOW (ref 12.0–15.0)
Immature Granulocytes: 1 %
Lymphocytes Relative: 28 %
Lymphs Abs: 1.6 10*3/uL (ref 0.7–4.0)
MCH: 26.9 pg (ref 26.0–34.0)
MCHC: 31.3 g/dL (ref 30.0–36.0)
MCV: 86.2 fL (ref 80.0–100.0)
Monocytes Absolute: 0.5 10*3/uL (ref 0.1–1.0)
Monocytes Relative: 8 %
Neutro Abs: 3.4 10*3/uL (ref 1.7–7.7)
Neutrophils Relative %: 59 %
Platelets: 222 10*3/uL (ref 150–400)
RBC: 4.12 MIL/uL (ref 3.87–5.11)
RDW: 14.4 % (ref 11.5–15.5)
WBC: 5.8 10*3/uL (ref 4.0–10.5)
nRBC: 0 % (ref 0.0–0.2)

## 2021-07-28 LAB — BRAIN NATRIURETIC PEPTIDE: B Natriuretic Peptide: 28 pg/mL (ref 0.0–100.0)

## 2021-07-28 LAB — BASIC METABOLIC PANEL
Anion gap: 7 (ref 5–15)
BUN: 41 mg/dL — ABNORMAL HIGH (ref 8–23)
CO2: 31 mmol/L (ref 22–32)
Calcium: 9.5 mg/dL (ref 8.9–10.3)
Chloride: 102 mmol/L (ref 98–111)
Creatinine, Ser: 1.05 mg/dL — ABNORMAL HIGH (ref 0.44–1.00)
GFR, Estimated: 51 mL/min — ABNORMAL LOW (ref >60)
Glucose, Bld: 103 mg/dL — ABNORMAL HIGH (ref 70–99)
Potassium: 4.4 mmol/L (ref 3.5–5.1)
Sodium: 140 mmol/L (ref 135–145)

## 2021-07-28 MED ORDER — OXYCODONE-ACETAMINOPHEN 5-325 MG PO TABS: 1.0000 | ORAL_TABLET | Freq: Three times a day (TID) | ORAL | 0 refills | Status: AC | PRN

## 2021-07-28 NOTE — ED Triage Notes (Signed)
Pt has pain and wound to left heel x 2 days; pt c/o bilateral feet swelling

## 2021-07-28 NOTE — ED Provider Notes (Signed)
[facility] EMERGENCY DEPARTMENT Provider Note   CSN: [id number] Arrival date & time: 07/28/21  2130     History  Chief Complaint  Patient presents with   Foot Pain    [name] is a 88 y.o. female.  HPI  With medical history including hypertension, CAD, diabetes presents  with complaints of bilateral knee pain ankle pain and swelling.  Patient states initially she was having heel pain, it was on both her heels start about 1 week ago, came on suddenly, worse on the left versus the right, worse with ambulation, improved with with rest, no trauma to the area, has never had this in the past.  She states she has tried soaking in Epsom salt without much relief.  She is then started to have knee pain knee pain is worse on the right versus the left, no recent trauma to the area, worse with ambulation.  Finally patient endorses bilateral leg swelling being on for about a month's time, states she been taking Lasix pills not much relief, denies any cardiac history, orthopnea, chest pain difficulty breathing.  Son at bedside able to validate the story.    Home Medications Prior to Admission medications   Medication Sig Start Date End Date Taking? Authorizing Provider  ALPRAZolam Prudy Feeler) 0.25 MG tablet Take 0.25 mg by mouth daily as needed for anxiety. 10/01/20  Yes [provider]  gabapentin (NEURONTIN) 600 MG tablet Take 600 mg by mouth 3 (three) times daily.     Yes [provider]  lisinopril (PRINIVIL,ZESTRIL) 20 MG tablet Take 20 mg by mouth daily.     Yes [provider]  metFORMIN (GLUCOPHAGE-XR) 500 MG 24 hr tablet Take 500 mg by mouth 2 (two) times daily. 06/29/21  Yes [provider]  oxyCODONE-acetaminophen (PERCOCET/ROXICET) 5-325 MG tablet Take 1 tablet by mouth every 8 (eight) hours as needed for up to 4 days for severe pain. 07/28/21 08/01/21 Yes Carroll Sage, PA-C  potassium chloride (KLOR-CON) 20 MEQ packet Take 20 mEq by mouth 2 (two)  times daily.   Yes [provider]  simvastatin (ZOCOR) 20 MG tablet Take 20 mg by mouth daily.    Yes [provider]  torsemide (DEMADEX) 20 MG tablet Take 20 mg by mouth daily. 11/03/20  Yes [provider]  verapamil (CALAN-SR) 120 MG CR tablet Take 120 mg by mouth daily.     Yes [provider]  cephALEXin (KEFLEX) 500 MG capsule Take 1 capsule (500 mg total) by mouth 4 (four) times daily. Patient not taking: Reported on 07/28/2021 12/24/20   Derwood Kaplan, MD  hydrocortisone cream 1 % Apply to affected area 2 times daily Patient not taking: Reported on 12/24/2020 09/23/19   [name], NP  nystatin (MYCOSTATIN/NYSTOP) 100000 UNIT/GM POWD Use 2-3 x daily to affected area prn Patient not taking: Reported on 09/17/2014 11/27/13   [name], NP      Allergies    Patient has no known allergies.    Review of Systems   Review of Systems  Constitutional:  Negative for chills and fever.  Respiratory:  Negative for shortness of breath.   Cardiovascular:  Positive for leg swelling. Negative for chest pain.  Gastrointestinal:  Negative for abdominal pain.  Musculoskeletal:        Bilateral knee and heel pain.  Neurological:  Negative for headaches.   Physical Exam Updated Vital Signs BP (!) 174/70   Pulse (!) 122   Temp 97.7 F (  36.5 C) (Oral)   Resp 17   Ht 5\' 3"  (1.6 m)   Wt 81.6 kg   SpO2 100%   BMI 31.89 kg/m  Physical Exam Vitals and nursing note reviewed.  Constitutional:      General: She is not in acute distress.    Appearance: She is not ill-appearing.  HENT:     Head: Normocephalic and atraumatic.     Nose: No congestion.  Eyes:     Conjunctiva/sclera: Conjunctivae normal.  Cardiovascular:     Rate and Rhythm: Normal rate and regular rhythm.     Pulses: Normal pulses.     Heart sounds: No murmur heard.   No friction rub. No gallop.  Pulmonary:     Effort: No respiratory distress.     Breath sounds: No wheezing,  rhonchi or rales.  Musculoskeletal:     Right lower leg: Edema present.     Left lower leg: Edema present.     Comments: Has 2+ edema up to the knees bilaterally nonpitting, no overlying skin changes, 2+ dorsal pedal pulses.  She has full range of motion, 5-5 strength neurovascular tact her lower extremities, she had point tenderness noted on her heels bilaterally, without crepitus or deformities noted no overlying skin changes present.  There is no palpable cords or calf tenderness present.  Skin:    General: Skin is warm and dry.  Neurological:     Mental Status: She is alert.  Psychiatric:        Mood and Affect: Mood normal.    ED Results / Procedures / Treatments   Labs (all labs ordered are listed, but only abnormal results are displayed) Labs Reviewed  BASIC METABOLIC PANEL - Abnormal; Notable for the following components:      Result Value   Glucose, Bld 103 (*)    BUN 41 (*)    Creatinine, Ser 1.05 (*)    GFR, Estimated 51 (*)    All other components within normal limits  CBC WITH DIFFERENTIAL/PLATELET - Abnormal; Notable for the following components:   Hemoglobin 11.1 (*)    HCT 35.5 (*)    All other components within normal limits  BRAIN NATRIURETIC PEPTIDE    EKG None  Radiology US Venous Img Lower Bilateral  Result Date: 07/28/2021 CLINICAL DATA:  Lower extremity pain and edema. EXAM: BILATERAL LOWER EXTREMITY VENOUS DOPPLER ULTRASOUND TECHNIQUE: Gray-scale sonography with graded compression, as well as color Doppler and duplex ultrasound were performed to evaluate the lower extremity deep venous systems from the level of the common femoral vein and including the common femoral, femoral, profunda femoral, popliteal and calf veins including the posterior tibial, peroneal and gastrocnemius veins when visible. The superficial great saphenous vein was also interrogated. Spectral Doppler was utilized to evaluate flow at rest and with distal augmentation maneuvers in the  common femoral, femoral and popliteal veins. COMPARISON:  None Available. FINDINGS: RIGHT LOWER EXTREMITY Common Femoral Vein: No evidence of thrombus. Normal compressibility, respiratory phasicity and response to augmentation. Saphenofemoral Junction: No evidence of thrombus. Normal compressibility and flow on color Doppler imaging. Profunda Femoral Vein: No evidence of thrombus. Normal compressibility and flow on color Doppler imaging. Femoral Vein: No evidence of thrombus. Normal compressibility, respiratory phasicity and response to augmentation. Popliteal Vein: No evidence of thrombus. Normal compressibility, respiratory phasicity and response to augmentation. Calf Veins: No evidence of thrombus. Normal compressibility and flow on color Doppler imaging. Superficial Great Saphenous Vein: No evidence of thrombus. Normal compressibility. Venous Reflux:  None. Other Findings: No evidence of superficial thrombophlebitis or abnormal fluid collection. LEFT LOWER EXTREMITY Common Femoral Vein: No evidence of thrombus. Normal compressibility, respiratory phasicity and response to augmentation. Saphenofemoral Junction: No evidence of thrombus. Normal compressibility and flow on color Doppler imaging. Profunda Femoral Vein: No evidence of thrombus. Normal compressibility and flow on color Doppler imaging. Femoral Vein: No evidence of thrombus. Normal compressibility, respiratory phasicity and response to augmentation. Popliteal Vein: No evidence of thrombus. Normal compressibility, respiratory phasicity and response to augmentation. Calf Veins: No evidence of thrombus. Normal compressibility and flow on color Doppler imaging. Superficial Great Saphenous Vein: No evidence of thrombus. Normal compressibility. Venous Reflux:  None. Other Findings: No evidence of superficial thrombophlebitis or abnormal fluid collection. IMPRESSION: No evidence of deep venous thrombosis in either lower extremity. Electronically Signed   By:  Irish Lack M.D.   On: 07/28/2021 12:03   DG Knee Complete 4 Views Left  Result Date: 07/28/2021 CLINICAL DATA:  Bilateral knee pain EXAM: LEFT KNEE - COMPLETE 4+ VIEW; RIGHT KNEE - COMPLETE 4+ VIEW COMPARISON:  Radiograph Jul 23, 2012. FINDINGS: No evidence of fracture, dislocation, or joint effusion. Bilateral tricompartment degenerative change worse in the patellofemoral compartments. Patellar enthesophytes. IMPRESSION: Bilateral tricompartment degenerative change, worse in the patellofemoral compartments. No acute osseous abnormality. Electronically Signed   By: Maudry Mayhew M.D.   On: 07/28/2021 11:33   DG Knee Complete 4 Views Right  Result Date: 07/28/2021 CLINICAL DATA:  Bilateral knee pain EXAM: LEFT KNEE - COMPLETE 4+ VIEW; RIGHT KNEE - COMPLETE 4+ VIEW COMPARISON:  Radiograph Jul 23, 2012. FINDINGS: No evidence of fracture, dislocation, or joint effusion. Bilateral tricompartment degenerative change worse in the patellofemoral compartments. Patellar enthesophytes. IMPRESSION: Bilateral tricompartment degenerative change, worse in the patellofemoral compartments. No acute osseous abnormality. Electronically Signed   By: Maudry Mayhew M.D.   On: 07/28/2021 11:33   DG Foot Complete Left  Addendum Date: 07/28/2021   ADDENDUM REPORT: 07/28/2021 12:19 ADDENDUM: Dictation error in the impression section of the report, this should state: 1. NO acute osseous abnormality. 2. Bilateral diffuse soft tissue swelling. 3. Bilateral midfoot predominant degenerative change. Electronically Signed   By: Maudry Mayhew M.D.   On: 07/28/2021 12:19   Result Date: 07/28/2021 CLINICAL DATA:  Bilateral heel pain. EXAM: LEFT FOOT - COMPLETE 3+ VIEW; RIGHT FOOT COMPLETE - 3+ VIEW COMPARISON:  Radiograph Jul 22, 2019. FINDINGS: There is no evidence of fracture or dislocation. Bilateral diffuse soft tissue swelling. Bilateral midfoot predominant degenerative change, progressed on the left since 2011. Degenerative  change of the ankles. Left-sided calcaneal enthesophytes. IMPRESSION: 1. Acute osseous abnormality. 2. Bilateral diffuse soft tissue swelling. 3. Bilateral midfoot predominant degenerative change. Electronically Signed: By: Maudry Mayhew M.D. On: 07/28/2021 11:29   DG Foot Complete Right  Addendum Date: 07/28/2021   ADDENDUM REPORT: 07/28/2021 12:19 ADDENDUM: Dictation error in the impression section of the report, this should state: 1. NO acute osseous abnormality. 2. Bilateral diffuse soft tissue swelling. 3. Bilateral midfoot predominant degenerative change. Electronically Signed   By: Maudry Mayhew M.D.   On: 07/28/2021 12:19   Result Date: 07/28/2021 CLINICAL DATA:  Bilateral heel pain. EXAM: LEFT FOOT - COMPLETE 3+ VIEW; RIGHT FOOT COMPLETE - 3+ VIEW COMPARISON:  Radiograph Jul 22, 2019. FINDINGS: There is no evidence of fracture or dislocation. Bilateral diffuse soft tissue swelling. Bilateral midfoot predominant degenerative change, progressed on the left since 2011. Degenerative change of the ankles. Left-sided calcaneal enthesophytes. IMPRESSION: 1. Acute osseous  abnormality. 2. Bilateral diffuse soft tissue swelling. 3. Bilateral midfoot predominant degenerative change. Electronically Signed: By: Maudry Mayhew M.D. On: 07/28/2021 11:29    Procedures Procedures    Medications Ordered in ED Medications - No data to display  ED Course/ Medical Decision Making/ A&P                           Medical Decision Making Amount and/or Complexity of Data Reviewed Labs: ordered. Radiology: ordered.   This patient presents to the ED for concern of presents with bilateral foot knee pain and swelling, this involves an extensive number of treatment options, and is a complaint that carries with it a high risk of complications and morbidity.  The differential diagnosis includes fracture, dislocation, DVT, CHF    Additional history obtained:  Additional history obtained from son at  bedside External records from outside source obtained and reviewed including previous PCP notes, pathology notes   Co morbidities that complicate the patient evaluation  Hypertension, diabetes  Social Determinants of Health:  Geriatric    Lab Tests:  I Ordered, and personally interpreted labs.  The pertinent results include: CBC shows hemoglobin of 11.1, BMP shows glucose of 103, BUN 41, creatinine 1.05 GFR 51 BNP 28   Imaging Studies ordered:  I ordered imaging studies including venous ultrasound, DG of left and right foot DG of left and right knee I independently visualized and interpreted imaging which showed all were negative for acute findings. I agree with the radiologist interpretation   Cardiac Monitoring:  The patient was maintained on a cardiac monitor.  I personally viewed and interpreted the cardiac monitored which showed an underlying rhythm of: N/A   Medicines ordered and prescription drug management:  I ordered medication including N/A I have reviewed the patients home medicines and have made adjustments as needed  Critical Interventions:  N/A   Reevaluation:  Presents with bilateral leg pain, she had swelling on my exam tenderness on the heels bilaterally, unclear etiology rule out pathological fractures, DVT and reassess  Patient is updated lab and imaging has no complaints they are agreement with plan and ready for discharge  Consultations Obtained:  Discharge    Considered:  Admission-this we deferred as patient is still ambulatory, pain is uncontrolled, will attempt outpatient therapy and follow-up with podiatry/PCP    Rule out I have low suspicion for CHF exacerbation lung sounds are clear bilaterally, denies any orthopnea, BNP is within normal limits.  I have low suspicion for DVT as study is negative.  Have low suspicion for fracture dislocation is imaging negative these findings.  Low suspicion for compartment syndrome as compartments  are soft nontender.     Dispostion and problem list  After consideration of the diagnostic results and the patients response to treatment, I feel that the patent would benefit from discharge.  Bilateral heel pain-likely acute on chronic pain, will recommend over-the-counter pain medications, follow-up with PCP/podiatry for further evaluation. Edema-likely this secondary due to chronic venous insufficiency, recommend follow-up with PCP for reassessment.            Final Clinical Impression(s) / ED Diagnoses Final diagnoses:  Pain of both heels  Pedal edema    Rx / DC Orders ED Discharge Orders          Ordered    oxyCODONE-acetaminophen (PERCOCET/ROXICET) 5-325 MG tablet  Every 8 hours PRN        07/28/21 1303  Carroll Sage, PA-C 07/28/21 1306    Bethann Berkshire, MD 07/30/21 [phone]

## 2021-07-28 NOTE — Discharge Instructions (Signed)
Heel pain-likely you bruised the heel itself, recommend rest, using over-the-counter pain medication as needed.  Given you pain medication please take as prescribed.  Please follow-up with podiatry or your PCP. Leg swelling-please continue with your Lasix, I recommend keeping your legs elevated while at rest and following up with your PCP for reassessment.  I have given you a short course of narcotics please take as prescribed.  This medication can make you drowsy do not consume alcohol or operate heavy machinery when taking this medication.  This medication is Tylenol in it do not take Tylenol and take this medication.    Come back to the emergency department if you develop chest pain, shortness of breath, severe abdominal pain, uncontrolled nausea, vomiting, diarrhea.

## 2021-08-06 DIAGNOSIS — I10 Essential (primary) hypertension: Secondary | ICD-10-CM | POA: Diagnosis not present

## 2021-08-06 DIAGNOSIS — E119 Type 2 diabetes mellitus without complications: Secondary | ICD-10-CM | POA: Diagnosis not present

## 2021-08-17 DIAGNOSIS — M1990 Unspecified osteoarthritis, unspecified site: Secondary | ICD-10-CM | POA: Diagnosis not present

## 2021-08-17 DIAGNOSIS — R600 Localized edema: Secondary | ICD-10-CM | POA: Diagnosis not present

## 2021-08-17 DIAGNOSIS — I10 Essential (primary) hypertension: Secondary | ICD-10-CM | POA: Diagnosis not present

## 2021-08-17 DIAGNOSIS — E1142 Type 2 diabetes mellitus with diabetic polyneuropathy: Secondary | ICD-10-CM | POA: Diagnosis not present

## 2021-09-16 DIAGNOSIS — E1140 Type 2 diabetes mellitus with diabetic neuropathy, unspecified: Secondary | ICD-10-CM | POA: Diagnosis not present

## 2021-09-16 DIAGNOSIS — I10 Essential (primary) hypertension: Secondary | ICD-10-CM | POA: Diagnosis not present

## 2021-10-17 DIAGNOSIS — I10 Essential (primary) hypertension: Secondary | ICD-10-CM | POA: Diagnosis not present

## 2021-10-17 DIAGNOSIS — E1165 Type 2 diabetes mellitus with hyperglycemia: Secondary | ICD-10-CM | POA: Diagnosis not present

## 2021-11-28 ENCOUNTER — Ambulatory Visit (HOSPITAL_COMMUNITY)
Admission: RE | Admit: 2021-11-28 | Discharge: 2021-11-28 | Disposition: A | Discharge status: Home / Self Care | Payer: Medicare Other | Source: Ambulatory Visit | Attending: Gerontology | Admitting: Gerontology

## 2021-11-28 ENCOUNTER — Other Ambulatory Visit (HOSPITAL_COMMUNITY): Payer: Self-pay | Admitting: Gerontology

## 2021-11-28 DIAGNOSIS — R296 Repeated falls: Secondary | ICD-10-CM | POA: Insufficient documentation

## 2021-11-28 DIAGNOSIS — I10 Essential (primary) hypertension: Secondary | ICD-10-CM | POA: Diagnosis not present

## 2021-11-28 DIAGNOSIS — Z043 Encounter for examination and observation following other accident: Secondary | ICD-10-CM | POA: Diagnosis not present

## 2021-11-28 DIAGNOSIS — Z23 Encounter for immunization: Secondary | ICD-10-CM | POA: Diagnosis not present

## 2021-11-28 DIAGNOSIS — Z1231 Encounter for screening mammogram for malignant neoplasm of breast: Secondary | ICD-10-CM

## 2021-11-28 DIAGNOSIS — E118 Type 2 diabetes mellitus with unspecified complications: Secondary | ICD-10-CM | POA: Diagnosis not present

## 2021-11-28 DIAGNOSIS — M1990 Unspecified osteoarthritis, unspecified site: Secondary | ICD-10-CM | POA: Diagnosis not present

## 2021-12-29 DIAGNOSIS — E118 Type 2 diabetes mellitus with unspecified complications: Secondary | ICD-10-CM | POA: Diagnosis not present

## 2021-12-29 DIAGNOSIS — I10 Essential (primary) hypertension: Secondary | ICD-10-CM | POA: Diagnosis not present

## 2022-01-09 ENCOUNTER — Ambulatory Visit (INDEPENDENT_AMBULATORY_CARE_PROVIDER_SITE_OTHER): Payer: Medicare Other | Admitting: Podiatry

## 2022-01-09 DIAGNOSIS — Z91199 Patient's noncompliance with other medical treatment and regimen due to unspecified reason: Secondary | ICD-10-CM

## 2022-01-09 NOTE — Progress Notes (Signed)
   Complete physical exam  Patient: [name]   DOB: 12/17/1996   88 y.o. Female  MRN: [id number]  Subjective:    No chief complaint on file.   [name] is a 88 y.o. female who presents today for a complete physical exam. She reports consuming a {diet types:17450} diet. {types:19826} She generally feels {DESC; WELL/FAIRLY WELL/POORLY:18703}. She reports sleeping {DESC; WELL/FAIRLY WELL/POORLY:18703}. She {does/does not:200015} have additional problems to discuss today.    Most recent fall risk assessment:    08/24/2021   10:42 AM  Fall Risk   Falls in the past year? 0  Number falls in past yr: 0  Injury with Fall? 0  Risk for fall due to : No Fall Risks  Follow up Falls evaluation completed     Most recent depression screenings:    08/24/2021   10:42 AM 07/15/2020   10:46 AM  PHQ 2/9 Scores  PHQ - 2 Score 0 0  PHQ- 9 Score 5     {VISON DENTAL STD PSA (Optional):27386}  {History (Optional):23778}  Patient Care Team: Jessup, Joy, NP as PCP - General (Nurse Practitioner)   Outpatient Medications Prior to Visit  Medication Sig   fluticasone (FLONASE) 50 MCG/ACT nasal spray Place 2 sprays into both nostrils in the morning and at bedtime. After 7 days, reduce to once daily.   norgestimate-ethinyl estradiol (SPRINTEC 28) 0.25-35 MG-MCG tablet Take 1 tablet by mouth daily.   Nystatin POWD Apply liberally to affected area 2 times per day   spironolactone (ALDACTONE) 100 MG tablet Take 1 tablet (100 mg total) by mouth daily.   No facility-administered medications prior to visit.    ROS        Objective:     There were no vitals taken for this visit. {Vitals History (Optional):23777}  Physical Exam   No results found for any visits on 09/29/21. {Show previous labs (optional):23779}    Assessment & Plan:    Routine Health Maintenance and Physical Exam  Immunization History  Administered Date(s) Administered   DTaP 03/02/1999, 04/28/1999,  07/07/1999, 03/22/2000, 10/06/2003   Hepatitis A 08/02/2007, 08/07/2008   Hepatitis B 12/18/1998, 01/25/1999, 07/07/1999   HiB (PRP-OMP) 03/02/1999, 04/28/1999, 07/07/1999, 03/22/2000   IPV 03/02/1999, 04/28/1999, 12/26/1999, 10/06/2003   Influenza,inj,Quad PF,6+ Mos 11/07/2013   Influenza-Unspecified 02/07/2012   MMR 12/25/2000, 10/06/2003   Meningococcal Polysaccharide 08/07/2011   Pneumococcal Conjugate-13 03/22/2000   Pneumococcal-Unspecified 07/07/1999, 09/20/1999   Tdap 08/07/2011   Varicella 12/26/1999, 08/02/2007    Health Maintenance  Topic Date Due   HIV Screening  Never done   Hepatitis C Screening  Never done   INFLUENZA VACCINE  09/27/2021   PAP-Cervical Cytology Screening  09/29/2021 (Originally 12/17/2019)   PAP SMEAR-Modifier  09/29/2021 (Originally 12/17/2019)   TETANUS/TDAP  09/29/2021 (Originally 08/06/2021)   HPV VACCINES  Discontinued   COVID-19 Vaccine  Discontinued    Discussed health benefits of physical activity, and encouraged her to engage in regular exercise appropriate for her age and condition.  Problem List Items Addressed This Visit   None Visit Diagnoses     Annual physical exam    -  Primary   Cervical cancer screening       Need for Tdap vaccination          No follow-ups on file.     Joy Jessup, NP   

## 2022-01-28 DIAGNOSIS — I10 Essential (primary) hypertension: Secondary | ICD-10-CM | POA: Diagnosis not present

## 2022-01-28 DIAGNOSIS — E118 Type 2 diabetes mellitus with unspecified complications: Secondary | ICD-10-CM | POA: Diagnosis not present

## 2022-02-28 DIAGNOSIS — E118 Type 2 diabetes mellitus with unspecified complications: Secondary | ICD-10-CM | POA: Diagnosis not present

## 2022-02-28 DIAGNOSIS — I10 Essential (primary) hypertension: Secondary | ICD-10-CM | POA: Diagnosis not present

## 2022-03-22 DIAGNOSIS — R600 Localized edema: Secondary | ICD-10-CM | POA: Diagnosis not present

## 2022-03-22 DIAGNOSIS — I10 Essential (primary) hypertension: Secondary | ICD-10-CM | POA: Diagnosis not present

## 2022-03-22 DIAGNOSIS — E118 Type 2 diabetes mellitus with unspecified complications: Secondary | ICD-10-CM | POA: Diagnosis not present

## 2022-03-22 DIAGNOSIS — M1990 Unspecified osteoarthritis, unspecified site: Secondary | ICD-10-CM | POA: Diagnosis not present

## 2022-04-22 DIAGNOSIS — E118 Type 2 diabetes mellitus with unspecified complications: Secondary | ICD-10-CM | POA: Diagnosis not present

## 2022-04-22 DIAGNOSIS — I10 Essential (primary) hypertension: Secondary | ICD-10-CM | POA: Diagnosis not present

## 2022-05-21 DIAGNOSIS — E118 Type 2 diabetes mellitus with unspecified complications: Secondary | ICD-10-CM | POA: Diagnosis not present

## 2022-05-21 DIAGNOSIS — I10 Essential (primary) hypertension: Secondary | ICD-10-CM | POA: Diagnosis not present

## 2022-05-29 DIAGNOSIS — E785 Hyperlipidemia, unspecified: Secondary | ICD-10-CM | POA: Diagnosis not present

## 2022-05-29 DIAGNOSIS — I10 Essential (primary) hypertension: Secondary | ICD-10-CM | POA: Diagnosis not present

## 2022-05-29 DIAGNOSIS — Z0001 Encounter for general adult medical examination with abnormal findings: Secondary | ICD-10-CM | POA: Diagnosis not present

## 2022-05-29 DIAGNOSIS — Z1389 Encounter for screening for other disorder: Secondary | ICD-10-CM | POA: Diagnosis not present

## 2022-05-29 DIAGNOSIS — E118 Type 2 diabetes mellitus with unspecified complications: Secondary | ICD-10-CM | POA: Diagnosis not present

## 2022-05-29 DIAGNOSIS — M1990 Unspecified osteoarthritis, unspecified site: Secondary | ICD-10-CM | POA: Diagnosis not present

## 2022-05-29 DIAGNOSIS — Z7189 Other specified counseling: Secondary | ICD-10-CM | POA: Diagnosis not present

## 2022-06-09 DIAGNOSIS — E1165 Type 2 diabetes mellitus with hyperglycemia: Secondary | ICD-10-CM | POA: Diagnosis not present

## 2022-06-09 DIAGNOSIS — Z0001 Encounter for general adult medical examination with abnormal findings: Secondary | ICD-10-CM | POA: Diagnosis not present

## 2022-06-09 DIAGNOSIS — I10 Essential (primary) hypertension: Secondary | ICD-10-CM | POA: Diagnosis not present

## 2022-06-28 DIAGNOSIS — E118 Type 2 diabetes mellitus with unspecified complications: Secondary | ICD-10-CM | POA: Diagnosis not present

## 2022-06-28 DIAGNOSIS — I10 Essential (primary) hypertension: Secondary | ICD-10-CM | POA: Diagnosis not present

## 2022-07-19 ENCOUNTER — Ambulatory Visit (INDEPENDENT_AMBULATORY_CARE_PROVIDER_SITE_OTHER): Payer: 59 | Admitting: Podiatry

## 2022-07-19 DIAGNOSIS — R601 Generalized edema: Secondary | ICD-10-CM

## 2022-07-19 DIAGNOSIS — M79609 Pain in unspecified limb: Secondary | ICD-10-CM

## 2022-07-19 DIAGNOSIS — M722 Plantar fascial fibromatosis: Secondary | ICD-10-CM | POA: Diagnosis not present

## 2022-07-19 DIAGNOSIS — B351 Tinea unguium: Secondary | ICD-10-CM

## 2022-07-19 MED ORDER — TRIAMCINOLONE ACETONIDE 10 MG/ML IJ SUSP
20.0000 mg | Freq: Once | INTRAMUSCULAR | Status: AC
  Administered 2022-07-19: 20 mg

## 2022-07-19 NOTE — Progress Notes (Signed)
  Subjective:   Patient ID: [name], female   DOB: 89 y.o.   MRN: [id number]   HPI Patient presents with a lot of pain in the heels of both feet and very thickened toenails of both feet that can be bothersome.  States that she does have swelling of a generalized nature and was concerned about this and also wanted this checked.  Patient does not smoke is not active   Review of Systems  All other systems reviewed and are negative.       Objective:  Physical Exam Vitals and nursing note reviewed.  Constitutional:      Appearance: She is well-developed.  Pulmonary:     Effort: Pulmonary effort is normal.  Musculoskeletal:        General: Normal range of motion.  Skin:    General: Skin is warm.  Neurological:     Mental Status: She is alert.     Neurovascular status found to be moderately diminished intact range of motion diminished subtalar midtarsal joint edema in the foot ankle and into the lower leg negative Homans' sign was noted.  I noted there to be exquisite discomfort in the plantar fascia bilateral and she has very thickened deformed nailbeds 1-5 both feet painful     Assessment:  Difficult condition due to advanced age edema and poor foot structure with inflammation of the plantar fascia bilateral and thickened painful toenails 1-5 both feet     Plan:  H&P reviewed everything went ahead today injected the plantar heel bilateral 3 mg dexamethasone Kenalog 5 mg Xylocaine debrided nailbeds 1-5 both feet no angiogenic bleeding reappoint routine care  X-rays do indicate collapse medial longitudinal arch spur formation no indication stress fracture arthritis

## 2022-07-29 DIAGNOSIS — E118 Type 2 diabetes mellitus with unspecified complications: Secondary | ICD-10-CM | POA: Diagnosis not present

## 2022-07-29 DIAGNOSIS — I10 Essential (primary) hypertension: Secondary | ICD-10-CM | POA: Diagnosis not present

## 2022-08-28 DIAGNOSIS — I10 Essential (primary) hypertension: Secondary | ICD-10-CM | POA: Diagnosis not present

## 2022-08-28 DIAGNOSIS — E118 Type 2 diabetes mellitus with unspecified complications: Secondary | ICD-10-CM | POA: Diagnosis not present

## 2022-09-14 DIAGNOSIS — E785 Hyperlipidemia, unspecified: Secondary | ICD-10-CM | POA: Diagnosis not present

## 2022-09-14 DIAGNOSIS — I10 Essential (primary) hypertension: Secondary | ICD-10-CM | POA: Diagnosis not present

## 2022-09-14 DIAGNOSIS — E118 Type 2 diabetes mellitus with unspecified complications: Secondary | ICD-10-CM | POA: Diagnosis not present

## 2022-09-14 DIAGNOSIS — M1990 Unspecified osteoarthritis, unspecified site: Secondary | ICD-10-CM | POA: Diagnosis not present

## 2022-10-15 DIAGNOSIS — E118 Type 2 diabetes mellitus with unspecified complications: Secondary | ICD-10-CM | POA: Diagnosis not present

## 2022-10-15 DIAGNOSIS — I10 Essential (primary) hypertension: Secondary | ICD-10-CM | POA: Diagnosis not present

## 2022-11-15 DIAGNOSIS — E118 Type 2 diabetes mellitus with unspecified complications: Secondary | ICD-10-CM | POA: Diagnosis not present

## 2022-11-15 DIAGNOSIS — I10 Essential (primary) hypertension: Secondary | ICD-10-CM | POA: Diagnosis not present

## 2022-12-18 DIAGNOSIS — E118 Type 2 diabetes mellitus with unspecified complications: Secondary | ICD-10-CM | POA: Diagnosis not present

## 2022-12-18 DIAGNOSIS — Z23 Encounter for immunization: Secondary | ICD-10-CM | POA: Diagnosis not present

## 2022-12-18 DIAGNOSIS — I10 Essential (primary) hypertension: Secondary | ICD-10-CM | POA: Diagnosis not present

## 2022-12-18 DIAGNOSIS — R600 Localized edema: Secondary | ICD-10-CM | POA: Diagnosis not present

## 2023-01-18 DIAGNOSIS — I10 Essential (primary) hypertension: Secondary | ICD-10-CM | POA: Diagnosis not present

## 2023-01-18 DIAGNOSIS — E118 Type 2 diabetes mellitus with unspecified complications: Secondary | ICD-10-CM | POA: Diagnosis not present

## 2023-01-23 DIAGNOSIS — I10 Essential (primary) hypertension: Secondary | ICD-10-CM | POA: Diagnosis not present

## 2023-01-29 DIAGNOSIS — I10 Essential (primary) hypertension: Secondary | ICD-10-CM | POA: Diagnosis not present

## 2023-01-29 DIAGNOSIS — E785 Hyperlipidemia, unspecified: Secondary | ICD-10-CM | POA: Diagnosis not present

## 2023-02-17 DIAGNOSIS — I10 Essential (primary) hypertension: Secondary | ICD-10-CM | POA: Diagnosis not present

## 2023-02-17 DIAGNOSIS — E118 Type 2 diabetes mellitus with unspecified complications: Secondary | ICD-10-CM | POA: Diagnosis not present

## 2023-03-03 ENCOUNTER — Encounter (HOSPITAL_COMMUNITY): Payer: Self-pay

## 2023-03-03 ENCOUNTER — Emergency Department (HOSPITAL_COMMUNITY)
Admission: EM | Admit: 2023-03-03 | Discharge: 2023-03-03 | Disposition: A | Discharge status: Home / Self Care | Payer: 59 | Attending: Emergency Medicine | Admitting: Emergency Medicine

## 2023-03-03 ENCOUNTER — Other Ambulatory Visit: Payer: Self-pay

## 2023-03-03 DIAGNOSIS — I509 Heart failure, unspecified: Secondary | ICD-10-CM | POA: Insufficient documentation

## 2023-03-03 DIAGNOSIS — M7989 Other specified soft tissue disorders: Secondary | ICD-10-CM | POA: Insufficient documentation

## 2023-03-03 DIAGNOSIS — R2243 Localized swelling, mass and lump, lower limb, bilateral: Secondary | ICD-10-CM | POA: Diagnosis not present

## 2023-03-03 LAB — COMPREHENSIVE METABOLIC PANEL
ALT: 6 U/L (ref 0–44)
AST: 12 U/L — ABNORMAL LOW (ref 15–41)
Albumin: 2.9 g/dL — ABNORMAL LOW (ref 3.5–5.0)
Alkaline Phosphatase: 76 U/L (ref 38–126)
Anion gap: 12 (ref 5–15)
BUN: 29 mg/dL — ABNORMAL HIGH (ref 8–23)
CO2: 24 mmol/L (ref 22–32)
Calcium: 9.4 mg/dL (ref 8.9–10.3)
Chloride: 106 mmol/L (ref 98–111)
Creatinine, Ser: 1.21 mg/dL — ABNORMAL HIGH (ref 0.44–1.00)
GFR, Estimated: 43 mL/min — ABNORMAL LOW (ref >60)
Glucose, Bld: 86 mg/dL (ref 70–99)
Potassium: 4.5 mmol/L (ref 3.5–5.1)
Sodium: 142 mmol/L (ref 135–145)
Total Bilirubin: 0.9 mg/dL (ref 0.0–1.2)
Total Protein: 6.6 g/dL (ref 6.5–8.1)

## 2023-03-03 LAB — CBC WITH DIFFERENTIAL/PLATELET
Abs Immature Granulocytes: 0.04 10*3/uL (ref 0.00–0.07)
Basophils Absolute: 0.1 10*3/uL (ref 0.0–0.1)
Basophils Relative: 1 %
Eosinophils Absolute: 0.2 10*3/uL (ref 0.0–0.5)
Eosinophils Relative: 3 %
HCT: 30.2 % — ABNORMAL LOW (ref 36.0–46.0)
Hemoglobin: 9.1 g/dL — ABNORMAL LOW (ref 12.0–15.0)
Immature Granulocytes: 1 %
Lymphocytes Relative: 18 %
Lymphs Abs: 1.4 10*3/uL (ref 0.7–4.0)
MCH: 25.5 pg — ABNORMAL LOW (ref 26.0–34.0)
MCHC: 30.1 g/dL (ref 30.0–36.0)
MCV: 84.6 fL (ref 80.0–100.0)
Monocytes Absolute: 0.7 10*3/uL (ref 0.1–1.0)
Monocytes Relative: 8 %
Neutro Abs: 5.7 10*3/uL (ref 1.7–7.7)
Neutrophils Relative %: 69 %
Platelets: 230 10*3/uL (ref 150–400)
RBC: 3.57 MIL/uL — ABNORMAL LOW (ref 3.87–5.11)
RDW: 15.1 % (ref 11.5–15.5)
WBC: 8.1 10*3/uL (ref 4.0–10.5)
nRBC: 0 % (ref 0.0–0.2)

## 2023-03-03 LAB — BRAIN NATRIURETIC PEPTIDE: B Natriuretic Peptide: 209.1 pg/mL — ABNORMAL HIGH (ref 0.0–100.0)

## 2023-03-03 NOTE — ED Notes (Signed)
In lobby

## 2023-03-03 NOTE — ED Provider Notes (Signed)
 [place] EMERGENCY DEPARTMENT AT [facility] Provider Note   CSN: [id number] Arrival date & time: 03/03/23  9047     History  Chief Complaint  Patient presents with   Leg Pain    [name] is a 89 y.o. female.  89 year old female with a history of heart failure, lymphedema who presents emergency department today due to bilateral lower extremity swelling that has been worsening over the last 1 month.  Patient says that previously her primary care doctor had her taking torsemide 20 mg twice daily, but she was recently decreased to once per day 2 months ago.  She last saw her doctor 2 months ago.  She denies any shortness of breath or chest pain.  No fever, no chills.   Leg Pain      Home Medications Prior to Admission medications   Medication Sig Start Date End Date Taking? Authorizing Provider  ALPRAZolam (XANAX) 0.25 MG tablet Take 0.25 mg by mouth daily as needed for anxiety. 10/01/20   [provider]  cephALEXin  (KEFLEX ) 500 MG capsule Take 1 capsule (500 mg total) by mouth 4 (four) times daily. Patient not taking: Reported on 07/28/2021 12/24/20   Nanavati, Ankit, MD  gabapentin (NEURONTIN) 600 MG tablet Take 600 mg by mouth 3 (three) times daily.      [provider]  hydrocortisone  cream 1 % Apply to affected area 2 times daily Patient not taking: Reported on 12/24/2020 09/23/19   Adah Corning A, NP  lisinopril (PRINIVIL,ZESTRIL) 20 MG tablet Take 20 mg by mouth daily.      [provider]  metFORMIN (GLUCOPHAGE-XR) 500 MG 24 hr tablet Take 500 mg by mouth 2 (two) times daily. 06/29/21   [provider]  nystatin  (MYCOSTATIN /NYSTOP ) 100000 UNIT/GM POWD Use 2-3 x daily to affected area prn Patient not taking: Reported on 09/17/2014 11/27/13   Signa Nest A, NP  potassium chloride (KLOR-CON) 20 MEQ packet Take 20 mEq by mouth 2 (two) times daily.    [provider]  simvastatin (ZOCOR) 20 MG tablet Take 20 mg by  mouth daily.     [provider]  torsemide (DEMADEX) 20 MG tablet Take 20 mg by mouth daily. 11/03/20   [provider]  verapamil (CALAN) 120 MG tablet  02/22/22   [provider]      Allergies    Patient has no known allergies.    Review of Systems   Review of Systems  Physical Exam Updated Vital Signs BP 97/73 (BP Location: Right Arm)   Pulse 86   Temp 97.7 F (36.5 C)   Resp 16   Ht 5' 3 (1.6 m)   Wt 77.6 kg   SpO2 100%   BMI 30.29 kg/m  Physical Exam Vitals reviewed.  Constitutional:      Appearance: She is not ill-appearing.  Cardiovascular:     Rate and Rhythm: Normal rate.     Pulses: Normal pulses.  Pulmonary:     Effort: Pulmonary effort is normal.     Breath sounds: No rales.  Musculoskeletal:     Right lower leg: Edema present.     Left lower leg: Edema present.  Neurological:     Mental Status: She is alert.     ED Results / Procedures / Treatments   Labs (all labs ordered are listed, but only abnormal results are displayed) Labs Reviewed  CBC WITH DIFFERENTIAL/PLATELET - Abnormal; Notable for the following components:  Result Value   RBC 3.57 (*)    Hemoglobin 9.1 (*)    HCT 30.2 (*)    MCH 25.5 (*)    All other components within normal limits  COMPREHENSIVE METABOLIC PANEL - Abnormal; Notable for the following components:   BUN 29 (*)    Creatinine, Ser 1.21 (*)    Albumin 2.9 (*)    AST 12 (*)    GFR, Estimated 43 (*)    All other components within normal limits  BRAIN NATRIURETIC PEPTIDE - Abnormal; Notable for the following components:   B Natriuretic Peptide 209.1 (*)    All other components within normal limits    EKG None  Radiology No results found.  Procedures Procedures    Medications Ordered in ED Medications - No data to display  ED Course/ Medical Decision Making/ A&P                                 Medical Decision Making 89 year old female here today with bilateral lower  extremity edema.  Differential diagnose include lymphedema, heart failure, less likely DVT, less likely arterial injury.  Plan # patient's symptoms are consistent with mild fluid overload versus lymphedema.  Patient has had success in the past with higher doses of diuretics.  Looking at her renal function, there does appear to be a progressive trend with slightly higher creatinine.  Patient's creatinine today 1.2.  Believe she will tolerate additional torsemide for 1 week well.  Also counseled patient on wearing compression socks.  No signs of infection on the patient, good pulses, exam not consistent with DVT.           Final Clinical Impression(s) / ED Diagnoses Final diagnoses:  Leg swelling    Rx / DC Orders ED Discharge Orders     None         Mannie Fairy DASEN, DO 03/03/23 1448

## 2023-03-03 NOTE — ED Triage Notes (Signed)
 PT arrived POV from home c/o bilateral leg and foot pain that has been ongoing for some time. Pt is a diabetic.

## 2023-03-03 NOTE — ED Provider Triage Note (Signed)
 Emergency Medicine Provider Triage Evaluation Note  [name] , a 89 y.o. female  was evaluated in triage.  Pt complains of BLE pain and swelling for the past week.  She reports that she recently decreased her fluid pill from twice a day to once a day.  Review of Systems  Positive: BLE pain and swelling Negative: Chest pain, shortness of breath, fever  Physical Exam  BP 131/62 (BP Location: Right Arm)   Pulse 81   Temp 98 F (36.7 C)   Resp 18   Ht 5' 3 (1.6 m)   Wt 77.6 kg   SpO2 100%   BMI 30.29 kg/m  Gen:   Awake, no distress   Resp:  Normal effort  MSK:   Moves extremities without difficulty  Other:    Medical Decision Making  Medically screening exam initiated at 11:17 AM.  Appropriate orders placed.  [name] was informed that the remainder of the evaluation will be completed by another provider, this initial triage assessment does not replace that evaluation, and the importance of remaining in the ED until their evaluation is complete.  Labs ordered   [name], [name] 03/03/23 1119

## 2023-03-03 NOTE — Discharge Instructions (Addendum)
 I would like you to take 20 mg of torsemide in the morning, and 20 mg of torsemide in the evening.  I would like you to do this for 1 week.  Please call your primary care doctor on Monday to set a follow-up appointment.  Return to the emergency department if you develop increased difficulty with your breathing, cough or shortness of breath.  You should also use compression socks.

## 2023-03-12 ENCOUNTER — Telehealth: Payer: Self-pay

## 2023-03-12 DIAGNOSIS — E118 Type 2 diabetes mellitus with unspecified complications: Secondary | ICD-10-CM | POA: Diagnosis not present

## 2023-03-12 DIAGNOSIS — M1990 Unspecified osteoarthritis, unspecified site: Secondary | ICD-10-CM | POA: Diagnosis not present

## 2023-03-12 DIAGNOSIS — I10 Essential (primary) hypertension: Secondary | ICD-10-CM | POA: Diagnosis not present

## 2023-03-12 DIAGNOSIS — R600 Localized edema: Secondary | ICD-10-CM | POA: Diagnosis not present

## 2023-03-12 NOTE — Progress Notes (Signed)
 Transition Care Management Follow-up Telephone Call Date of discharge and from where: 03/03/2023 [facility] How have you been since you were released from the hospital? Patient stated the swelling in her legs has decreased. Any questions or concerns? No  Items Reviewed: Did the pt receive and understand the discharge instructions provided? Yes  Medications obtained and verified?  No medication prescribed Other? No  Any new allergies since your discharge? No  Dietary orders reviewed? Yes Do you have support at home? Yes   Follow up appointments reviewed:  PCP Hospital f/u appt confirmed?  Patient stated she has an appointment scheduled today 03/12/23.  Scheduled to see  on  @ . Specialist Hospital f/u appt confirmed? No  Scheduled to see  on  @ . Are transportation arrangements needed? No  If their condition worsens, is the pt aware to call PCP or go to the Emergency Dept.? Yes Was the patient provided with contact information for the PCP's office or ED? Yes Was to pt encouraged to call back with questions or concerns? Yes   [name] [facility]  [facility] [place] Guide Direct Dial: [phone]  Website: [email or web address]

## 2023-05-23 ENCOUNTER — Ambulatory Visit (HOSPITAL_COMMUNITY)
Admission: EM | Admit: 2023-05-23 | Discharge: 2023-05-23 | Disposition: A | Discharge status: Home / Self Care | Payer: Medicare (Managed Care) | Attending: Sports Medicine | Admitting: Sports Medicine

## 2023-05-23 ENCOUNTER — Encounter (HOSPITAL_COMMUNITY): Payer: Self-pay

## 2023-05-23 DIAGNOSIS — M170 Bilateral primary osteoarthritis of knee: Secondary | ICD-10-CM | POA: Diagnosis not present

## 2023-05-23 DIAGNOSIS — M722 Plantar fascial fibromatosis: Secondary | ICD-10-CM | POA: Diagnosis not present

## 2023-05-23 MED ORDER — KETOROLAC TROMETHAMINE 30 MG/ML IJ SOLN
INTRAMUSCULAR | Status: AC
  Filled 2023-05-23: qty 1

## 2023-05-23 MED ORDER — KETOROLAC TROMETHAMINE 30 MG/ML IJ SOLN
30.0000 mg | Freq: Once | INTRAMUSCULAR | Status: AC
  Administered 2023-05-23: 30 mg via INTRAMUSCULAR

## 2023-05-23 MED ORDER — MELOXICAM 7.5 MG PO TABS: 15.0000 mg | ORAL_TABLET | Freq: Every day | ORAL | 0 refills | Status: DC

## 2023-05-23 NOTE — ED Triage Notes (Signed)
 Patient c/o bilateral heel, foot, and knee pain x 1 month.    Patient has been taking ibuprofen.

## 2023-05-23 NOTE — ED Provider Notes (Signed)
 [facility]    CSN: [id number] Arrival date & time: 05/23/23  0805      History   Chief Complaint Chief Complaint  Patient presents with   Foot Pain   Knee Pain    HPI [name] is a 89 y.o. female with PMH of HTN, CAD, diabetes here with 1 month of bilateral knee pain and bilateral heel pain. She denies any inciting injury or event. No recent falls. She states her knee pain is equal bilaterally and more anterior and medial in ocation. She has tried ibuprofen without much benefit. Reports she has previously gotten cortisone injections though hasn't had any in many years.  For her heels she endorses a sharp stabbing pain at the heel that radiates up the plantar surface of the foot. She hasn't tried anything for this. Denies history of plantar fasciitis. She previously was ambulatory but has been more dependent on wheelchair for ambulation recently due to the pain in her knees and feet. She has chronic BLE edema and feels this is at her baseline. She reports compliance with her diuretics. Denies any chest pain, dyspnea, or orthopnea.  Foot Pain  Knee Pain   Past Medical History:  Diagnosis Date   Acid reflux    Arthritis    Burning with urination 11/27/2013   Coronary artery disease    Diabetes mellitus    Glaucoma (increased eye pressure)    Hematuria 11/27/2013   Hypertension    Superficial fungus infection of skin 11/27/2013    Patient Active Problem List   Diagnosis Date Noted   Hematuria 11/27/2013   Burning with urination 11/27/2013   Superficial fungus infection of skin 11/27/2013   OA (osteoarthritis) of knee 07/24/2012    Past Surgical History:  Procedure Laterality Date   ABDOMINAL HYSTERECTOMY     APPENDECTOMY     CHOLECYSTECTOMY      OB History     Gravida  2   Para  2   Term  0   Preterm  0   AB  0   Living         SAB  0   IAB  0   Ectopic  0   Multiple      Live Births               Home Medications     Prior to Admission medications   Medication Sig Start Date End Date Taking? Authorizing Provider  meloxicam (MOBIC) 7.5 MG tablet Take 2 tablets (15 mg total) by mouth daily. 05/23/23  Yes Marisa Cyphers, MD  ALPRAZolam Prudy Feeler) 0.25 MG tablet Take 0.25 mg by mouth daily as needed for anxiety. 10/01/20   [provider]  cephALEXin (KEFLEX) 500 MG capsule Take 1 capsule (500 mg total) by mouth 4 (four) times daily. Patient not taking: Reported on 07/28/2021 12/24/20   Derwood Kaplan, MD  gabapentin (NEURONTIN) 600 MG tablet Take 600 mg by mouth 3 (three) times daily.      [provider]  hydrocortisone cream 1 % Apply to affected area 2 times daily Patient not taking: Reported on 12/24/2020 09/23/19   [name], FNP  lisinopril (PRINIVIL,ZESTRIL) 20 MG tablet Take 20 mg by mouth daily.      [provider]  metFORMIN (GLUCOPHAGE-XR) 500 MG 24 hr tablet Take 500 mg by mouth 2 (two) times daily. 06/29/21   [provider]  nystatin (MYCOSTATIN/NYSTOP) 100000 UNIT/GM POWD Use 2-3 x daily to affected  area prn Patient not taking: Reported on 09/17/2014 11/27/13   Cyril Mourning A, NP  potassium chloride (KLOR-CON) 20 MEQ packet Take 20 mEq by mouth 2 (two) times daily.    [provider]  simvastatin (ZOCOR) 20 MG tablet Take 20 mg by mouth daily.     [provider]  torsemide (DEMADEX) 20 MG tablet Take 20 mg by mouth daily. 11/03/20   [provider]  verapamil (CALAN) 120 MG tablet  02/22/22   [provider]    Family History Family History  Problem Relation Age of Onset   Heart disease Father    Kidney disease Father        kidney failure   Diabetes Brother    Other Sister        cardiac arrest   Cancer Sister        lung   Diabetes Sister    Cancer Brother        lung   Alcohol abuse Brother    Alcohol abuse Brother     Social History Social History   Tobacco Use   Smoking status: Never   Smokeless  tobacco: Never  Vaping Use   Vaping status: Never Used  Substance Use Topics   Alcohol use: No   Drug use: No     Allergies   Patient has no known allergies.   Review of Systems Review of Systems   Physical Exam Updated Vital Signs BP (!) 125/54 (BP Location: Right Arm)   Pulse 89   Temp 99.8 F (37.7 C) (Oral)   Resp 16   SpO2 95%   Physical Exam Constitutional:      General: She is not in acute distress.    Appearance: Normal appearance. She is obese. She is not toxic-appearing.  HENT:     Head: Normocephalic and atraumatic.  Cardiovascular:     Rate and Rhythm: Normal rate and regular rhythm.     Pulses: Normal pulses.     Heart sounds: Normal heart sounds.  Pulmonary:     Effort: Pulmonary effort is normal.     Breath sounds: Normal breath sounds.  Musculoskeletal:     Comments: Bilateral Knee Exam: No gross deformity, ecchymoses. She does have dispersed hyperpigmented skin lesions over her anterior knees that she reports are about a month old (leaned against a radiant heater). Bilateral LE edema from midfoot to the mid calf, 2+ pitting edema. No calf tenderness. TTP along medial patella facets and medial>lateral joint lines.Marland Kitchen ROM slightly limited in flexion with normal strength. NV intact distally.  Bilateral Foot Exam: Diffuse soft tissue pitting edema.  FROM Strength is 5/5 in all directions TTP at the calcaneus insertion fo the plantar fascia and through the mid long arch. No midfoot tarsal/metatarsal TTP. Negative Calcaneal Squeeze. Negative Windlass.   Neurological:     Mental Status: She is alert.      UC Treatments / Results  Labs (all labs ordered are listed, but only abnormal results are displayed) Labs Reviewed - No data to display  EKG   Radiology No results found.  Procedures Procedures (including critical care time)  Medications Ordered in UC Medications  ketorolac (TORADOL) 30 MG/ML injection 30 mg (30 mg Intramuscular Given  05/23/23 0917)    Initial Impression / Assessment and Plan / UC Course  I have reviewed the triage vital signs and the nursing notes.  Pertinent labs & imaging results that were available during my care of the patient were reviewed  by me and considered in my medical decision making (see chart for details).    Patient is well-appearing, normotensive, afebrile, not tachycardic, not tachypneic, oxygenating well on room air.   Primary osteoarthritis of both knees  Plantar fasciitis, bilateral Exam and review of her x-rays from 2023 show tricompartmental knee OA as well as midfoot OA bilaterally. I do think her knee pain is 2/2 to OA, though her foot pain is more in line with plantar fasciitis. No red flags Provided an IM Toradol injection today and Rx for Meloxicam 15mg  x 14d. Recommended follow-up with sports medicine for U/S guided knee injections and further management of plantar fasciitis. Return and ER precautions discussed Patient's questions were answered and they are in agreement with this plan  Final Clinical Impressions(s) / UC Diagnoses   Final diagnoses:  Primary osteoarthritis of both knees  Plantar fasciitis, bilateral     Discharge Instructions      You were seen for bilateral foot and knee pain.  Your knee pain is because of arthritis and the heel pain is because of plantar fasciitis. I recommend you take tylenol 1000mg  every 8 hours as needed for pain and two weeks of daily Meloxicam 15mg . A prescription for the meloxicam was sent to your pharmacy. Roll out the bottom of your feet with a frozen water bottle for 10-15 minutes each foot 2-3 times daily. Wear supportive shoes/sneakers with good arch support.  Call the Sports Medicine office to schedule a follow-up in 2-3 weeks. [facility] Address: [address] [place], [place], [place] [address] Phone: [phone]   ED Prescriptions     Medication Sig Dispense Auth. Provider   meloxicam (MOBIC)  7.5 MG tablet Take 2 tablets (15 mg total) by mouth daily. 28 tablet Marisa Cyphers, MD      PDMP not reviewed this encounter.   Marisa Cyphers, MD 05/23/23 [phone]

## 2023-05-23 NOTE — Discharge Instructions (Signed)
 You were seen for bilateral foot and knee pain.  Your knee pain is because of arthritis and the heel pain is because of plantar fasciitis. I recommend you take tylenol 1000mg  every 8 hours as needed for pain and two weeks of daily Meloxicam 15mg . A prescription for the meloxicam was sent to your pharmacy. Roll out the bottom of your feet with a frozen water bottle for 10-15 minutes each foot 2-3 times daily. Wear supportive shoes/sneakers with good arch support.  Call the Sports Medicine office to schedule a follow-up in 2-3 weeks. [facility] Address: [address] [place], [place], [place] [address] Phone: [phone]

## 2023-06-11 ENCOUNTER — Encounter: Payer: Self-pay | Admitting: Family Medicine

## 2023-06-11 ENCOUNTER — Ambulatory Visit: Payer: Medicare (Managed Care) | Admitting: Family Medicine

## 2023-06-11 VITALS — BP 130/70 | Ht 63.0 in | Wt 170.0 lb

## 2023-06-11 DIAGNOSIS — M79671 Pain in right foot: Secondary | ICD-10-CM | POA: Diagnosis not present

## 2023-06-11 DIAGNOSIS — M79672 Pain in left foot: Secondary | ICD-10-CM | POA: Diagnosis not present

## 2023-06-11 DIAGNOSIS — E1142 Type 2 diabetes mellitus with diabetic polyneuropathy: Secondary | ICD-10-CM | POA: Diagnosis not present

## 2023-06-11 DIAGNOSIS — M170 Bilateral primary osteoarthritis of knee: Secondary | ICD-10-CM

## 2023-06-11 MED ORDER — METHYLPREDNISOLONE ACETATE 40 MG/ML IJ SUSP
40.0000 mg | Freq: Once | INTRAMUSCULAR | Status: AC
  Administered 2023-06-11: 40 mg via INTRA_ARTICULAR

## 2023-06-11 NOTE — Progress Notes (Signed)
 Subjective:   HPI: Patient is a 89 y.o. female with PMH of T2DM here for bilateral knee and foot pain. Pain has been worsening for the past month. Recently diagnosed with osteoarthritis. Has been taking Tylenol and meloxicam for knee pain. Has history of similar pain and 3 steroid injections in the past. Last injection was about a year ago. No recent falls or injuries to the knee. No numbness, but endorses tingling of foot.     Past Medical History:  Diagnosis Date   Acid reflux    Arthritis    Burning with urination 11/27/2013   Coronary artery disease    Diabetes mellitus    Glaucoma (increased eye pressure)    Hematuria 11/27/2013   Hypertension    Superficial fungus infection of skin 11/27/2013    Current Outpatient Medications on File Prior to Visit  Medication Sig Dispense Refill   ALPRAZolam (XANAX) 0.25 MG tablet Take 0.25 mg by mouth daily as needed for anxiety.     cephALEXin (KEFLEX) 500 MG capsule Take 1 capsule (500 mg total) by mouth 4 (four) times daily. (Patient not taking: Reported on 07/28/2021) 28 capsule 0   gabapentin (NEURONTIN) 600 MG tablet Take 600 mg by mouth 3 (three) times daily.       hydrocortisone cream 1 % Apply to affected area 2 times daily (Patient not taking: Reported on 12/24/2020) 15 g 0   lisinopril (PRINIVIL,ZESTRIL) 20 MG tablet Take 20 mg by mouth daily.       meloxicam (MOBIC) 7.5 MG tablet Take 2 tablets (15 mg total) by mouth daily. 28 tablet 0   metFORMIN (GLUCOPHAGE-XR) 500 MG 24 hr tablet Take 500 mg by mouth 2 (two) times daily.     nystatin (MYCOSTATIN/NYSTOP) 100000 UNIT/GM POWD Use 2-3 x daily to affected area prn (Patient not taking: Reported on 09/17/2014) 30 g 3   potassium chloride (KLOR-CON) 20 MEQ packet Take 20 mEq by mouth 2 (two) times daily.     simvastatin (ZOCOR) 20 MG tablet Take 20 mg by mouth daily.      torsemide (DEMADEX) 20 MG tablet Take 20 mg by mouth daily.     verapamil (CALAN) 120 MG tablet      No current  facility-administered medications on file prior to visit.    Past Surgical History:  Procedure Laterality Date   ABDOMINAL HYSTERECTOMY     APPENDECTOMY     CHOLECYSTECTOMY      No Known Allergies  BP 130/70   Ht 5\' 3"  (1.6 m)   Wt 170 lb (77.1 kg)   BMI 30.11 kg/m       No data to display              No data to display              Objective:  Physical Exam:  Gen: NAD, comfortable in exam room  MSK:   Bilateral Knee: significant swelling around knee, but no erythema or bruising. TTP around knee, more pronounced on medial knee. ROM limited on flexion and extension. Strength limited on resisted abduction, adduction, flexion, and extension. Mcmurray test positive.   Foot: significant nail deformity with hyperkeratotic, thickened and discolored nails noted on foot exam. Significant swelling around ankle and foot. TTP more pronounced near heel of plantar surface of foot. ROM full. Strength 5/5 on plantarflexion and dorsiflexion.   Neuro: sensation intact on bilateral foot and leg. Endorses tingling in lower leg and foot.  Assessment & Plan:  89 year old female with T2DM presenting with worsening bilateral knee and foot pain, consistent with osteoarthritis exacerbation. Exam shows bilateral knee swelling, medial joint line tenderness, limited ROM, reduced strength, and a positive McMurray test, supporting intra-articular pathology. Foot exam reveals plantar heel tenderness and nail changes consistent with onychomycosis. Patient endorses tingling in the feet, though sensation remains intact, likely indicating mild diabetic peripheral neuropathy.   Plan: - Completed steroid injection in bilateral knee this visit.  - Start voltaren gel for localized joint pain relief  - Continue Tylenol as needed for pain relief, discontinue Meloxicam and avoid or limit other NSAIDs.   - Recommend supportive footwear and consider orthopedic shoe evaluation - Follow up for next steroid  injection in 3 months

## 2023-06-11 NOTE — Patient Instructions (Signed)
 Today you received an injection with a corticosteroid (aka: cortisone injection). This injection is usually done in response to pain and inflammation. There is some "numbing medicine" (Lidocaine) in the shot, so the injected area may be numb and feel really good for the next couple of hours. The numbing medicine usually wears off in 2-3 hours, and then your pain level may be back to where it was before the injection until the cortisone starts working.    The actually benefit from the steroid injection is usually noticed within 3-5 days, but may take up to 14 days. You may actually experience a small (as in 10%) INCREASE in pain in the first 24 hours---that is common.  Things to watch out for that you should contact us  or a health care provider urgently would include: 1. Unusual (as in more than 10%) increase in pain 2. New fever > 101.5 3. New swelling or redness of the injected area. 4. Streaking of red lines around the area injected.  --------------------- You should stop taking Meloxicam  You would benefit from the use of Voltaren gel every 6-hrs as needed.  This is a topical anti-inflammatory medication to help with pain and inflammation/swelling.  You can purchase this at your local pharmacy over-the-counter.  You can apply to both knees  You can continue using Tylenol as needed  You should follow-up with me in 6-8 weeks or sooner as needed.  Do not hesitate to call or reach out with any questions or concerns.

## 2023-07-05 ENCOUNTER — Ambulatory Visit (HOSPITAL_COMMUNITY)
Admission: RE | Admit: 2023-07-05 | Discharge: 2023-07-05 | Disposition: A | Discharge status: Home / Self Care | Source: Ambulatory Visit | Attending: Gerontology | Admitting: Gerontology

## 2023-07-05 ENCOUNTER — Other Ambulatory Visit (HOSPITAL_COMMUNITY): Payer: Self-pay | Admitting: Gerontology

## 2023-07-05 DIAGNOSIS — R059 Cough, unspecified: Secondary | ICD-10-CM | POA: Insufficient documentation

## 2023-07-05 DIAGNOSIS — R0602 Shortness of breath: Secondary | ICD-10-CM | POA: Diagnosis present

## 2023-07-06 ENCOUNTER — Observation Stay (HOSPITAL_COMMUNITY)
Admission: EM | Admit: 2023-07-06 | Discharge: 2023-07-08 | Disposition: A | Discharge status: Home / Self Care | Attending: Student | Admitting: Student

## 2023-07-06 ENCOUNTER — Encounter (HOSPITAL_COMMUNITY): Payer: Self-pay | Admitting: *Deleted

## 2023-07-06 ENCOUNTER — Other Ambulatory Visit: Payer: Self-pay

## 2023-07-06 DIAGNOSIS — Z794 Long term (current) use of insulin: Secondary | ICD-10-CM | POA: Diagnosis not present

## 2023-07-06 DIAGNOSIS — R011 Cardiac murmur, unspecified: Secondary | ICD-10-CM | POA: Diagnosis not present

## 2023-07-06 DIAGNOSIS — Z79899 Other long term (current) drug therapy: Secondary | ICD-10-CM | POA: Insufficient documentation

## 2023-07-06 DIAGNOSIS — M1990 Unspecified osteoarthritis, unspecified site: Secondary | ICD-10-CM | POA: Diagnosis not present

## 2023-07-06 DIAGNOSIS — D509 Iron deficiency anemia, unspecified: Principal | ICD-10-CM

## 2023-07-06 DIAGNOSIS — E119 Type 2 diabetes mellitus without complications: Secondary | ICD-10-CM | POA: Diagnosis not present

## 2023-07-06 DIAGNOSIS — I2510 Atherosclerotic heart disease of native coronary artery without angina pectoris: Secondary | ICD-10-CM | POA: Diagnosis not present

## 2023-07-06 DIAGNOSIS — R609 Edema, unspecified: Secondary | ICD-10-CM | POA: Insufficient documentation

## 2023-07-06 DIAGNOSIS — I10 Essential (primary) hypertension: Secondary | ICD-10-CM | POA: Insufficient documentation

## 2023-07-06 DIAGNOSIS — R799 Abnormal finding of blood chemistry, unspecified: Secondary | ICD-10-CM | POA: Diagnosis present

## 2023-07-06 LAB — POC OCCULT BLOOD, ED: Fecal Occult Blood: NEGATIVE

## 2023-07-06 LAB — COMPREHENSIVE METABOLIC PANEL WITH GFR
ALT: 8 U/L (ref 0–44)
AST: 14 U/L — ABNORMAL LOW (ref 15–41)
Albumin: 3.2 g/dL — ABNORMAL LOW (ref 3.5–5.0)
Alkaline Phosphatase: 88 U/L (ref 38–126)
Anion gap: 10 (ref 5–15)
BUN: 27 mg/dL — ABNORMAL HIGH (ref 8–23)
CO2: 24 mmol/L (ref 22–32)
Calcium: 9.3 mg/dL (ref 8.9–10.3)
Chloride: 105 mmol/L (ref 98–111)
Creatinine, Ser: 0.99 mg/dL (ref 0.44–1.00)
GFR, Estimated: 54 mL/min — ABNORMAL LOW (ref >60)
Glucose, Bld: 97 mg/dL (ref 70–99)
Potassium: 4.6 mmol/L (ref 3.5–5.1)
Sodium: 139 mmol/L (ref 135–145)
Total Bilirubin: 0.7 mg/dL (ref 0.0–1.2)
Total Protein: 6.5 g/dL (ref 6.5–8.1)

## 2023-07-06 LAB — CBC WITH DIFFERENTIAL/PLATELET
Abs Immature Granulocytes: 0.02 10*3/uL (ref 0.00–0.07)
Basophils Absolute: 0.1 10*3/uL (ref 0.0–0.1)
Basophils Relative: 1 %
Eosinophils Absolute: 0.1 10*3/uL (ref 0.0–0.5)
Eosinophils Relative: 1 %
HCT: 21.3 % — ABNORMAL LOW (ref 36.0–46.0)
Hemoglobin: 6.3 g/dL — CL (ref 12.0–15.0)
Immature Granulocytes: 0 %
Lymphocytes Relative: 25 %
Lymphs Abs: 1.4 10*3/uL (ref 0.7–4.0)
MCH: 20.9 pg — ABNORMAL LOW (ref 26.0–34.0)
MCHC: 29.6 g/dL — ABNORMAL LOW (ref 30.0–36.0)
MCV: 70.8 fL — ABNORMAL LOW (ref 80.0–100.0)
Monocytes Absolute: 0.6 10*3/uL (ref 0.1–1.0)
Monocytes Relative: 11 %
Neutro Abs: 3.5 10*3/uL (ref 1.7–7.7)
Neutrophils Relative %: 62 %
Platelets: 282 10*3/uL (ref 150–400)
RBC: 3.01 MIL/uL — ABNORMAL LOW (ref 3.87–5.11)
RDW: 18.7 % — ABNORMAL HIGH (ref 11.5–15.5)
WBC: 5.7 10*3/uL (ref 4.0–10.5)
nRBC: 0 % (ref 0.0–0.2)

## 2023-07-06 LAB — HEMOGLOBIN A1C
Hgb A1c MFr Bld: 5.3 % (ref 4.8–5.6)
Mean Plasma Glucose: 105.41 mg/dL

## 2023-07-06 LAB — RETICULOCYTES
Immature Retic Fract: 16.1 % — ABNORMAL HIGH (ref 2.3–15.9)
RBC.: 3.04 MIL/uL — ABNORMAL LOW (ref 3.87–5.11)
Retic Count, Absolute: 15.2 10*3/uL — ABNORMAL LOW (ref 19.0–186.0)
Retic Ct Pct: 0.5 % (ref 0.4–3.1)

## 2023-07-06 LAB — IRON AND TIBC
Iron: 15 ug/dL — ABNORMAL LOW (ref 28–170)
Saturation Ratios: 3 % — ABNORMAL LOW (ref 10.4–31.8)
TIBC: 444 ug/dL (ref 250–450)
UIBC: 429 ug/dL

## 2023-07-06 LAB — PREPARE RBC (CROSSMATCH)

## 2023-07-06 LAB — MAGNESIUM: Magnesium: 2 mg/dL (ref 1.7–2.4)

## 2023-07-06 LAB — HEMOGLOBIN AND HEMATOCRIT, BLOOD
HCT: 24.6 % — ABNORMAL LOW (ref 36.0–46.0)
Hemoglobin: 7.8 g/dL — ABNORMAL LOW (ref 12.0–15.0)

## 2023-07-06 LAB — GLUCOSE, CAPILLARY
Glucose-Capillary: 76 mg/dL (ref 70–99)
Glucose-Capillary: 104 mg/dL — ABNORMAL HIGH (ref 70–99)

## 2023-07-06 LAB — FOLATE: Folate: 15.3 ng/mL (ref >5.9)

## 2023-07-06 LAB — BRAIN NATRIURETIC PEPTIDE: B Natriuretic Peptide: 429 pg/mL — ABNORMAL HIGH (ref 0.0–100.0)

## 2023-07-06 LAB — VITAMIN B12: Vitamin B-12: 236 pg/mL (ref 180–914)

## 2023-07-06 LAB — ABO/RH: ABO/RH(D): O POS

## 2023-07-06 LAB — FERRITIN: Ferritin: 6 ng/mL — ABNORMAL LOW (ref 11–307)

## 2023-07-06 MED ORDER — PANTOPRAZOLE SODIUM 40 MG IV SOLR
40.0000 mg | Freq: Once | INTRAVENOUS | Status: AC
  Administered 2023-07-06: 40 mg via INTRAVENOUS
  Filled 2023-07-06: qty 10

## 2023-07-06 MED ORDER — ONDANSETRON HCL 4 MG/2ML IJ SOLN: 4.0000 mg | Freq: Four times a day (QID) | INTRAMUSCULAR | Status: DC | PRN

## 2023-07-06 MED ORDER — FUROSEMIDE 10 MG/ML IJ SOLN
20.0000 mg | Freq: Once | INTRAMUSCULAR | Status: AC
  Administered 2023-07-06: 20 mg via INTRAVENOUS
  Filled 2023-07-06: qty 2

## 2023-07-06 MED ORDER — ACETAMINOPHEN 650 MG RE SUPP: 650.0000 mg | Freq: Four times a day (QID) | RECTAL | Status: DC | PRN

## 2023-07-06 MED ORDER — FOLIC ACID 1 MG PO TABS
1.0000 mg | ORAL_TABLET | Freq: Every day | ORAL | Status: DC
  Administered 2023-07-07 – 2023-07-08 (×2): 1 mg via ORAL
  Filled 2023-07-06 (×2): qty 1

## 2023-07-06 MED ORDER — ACETAMINOPHEN 325 MG PO TABS
650.0000 mg | ORAL_TABLET | Freq: Four times a day (QID) | ORAL | Status: DC | PRN
  Administered 2023-07-06 – 2023-07-07 (×2): 650 mg via ORAL
  Filled 2023-07-06 (×2): qty 2

## 2023-07-06 MED ORDER — SODIUM CHLORIDE 0.9% IV SOLUTION: Freq: Once | INTRAVENOUS | Status: AC

## 2023-07-06 MED ORDER — INSULIN ASPART 100 UNIT/ML IJ SOLN: 0.0000 [IU] | Freq: Three times a day (TID) | INTRAMUSCULAR | Status: DC

## 2023-07-06 MED ORDER — FERROUS SULFATE 325 (65 FE) MG PO TABS
325.0000 mg | ORAL_TABLET | Freq: Two times a day (BID) | ORAL | Status: DC
  Administered 2023-07-07 – 2023-07-08 (×3): 325 mg via ORAL
  Filled 2023-07-06 (×3): qty 1

## 2023-07-06 MED ORDER — VITAMIN B-12 100 MCG PO TABS
500.0000 ug | ORAL_TABLET | Freq: Every day | ORAL | Status: DC
  Administered 2023-07-07 – 2023-07-08 (×2): 500 ug via ORAL
  Filled 2023-07-06 (×2): qty 5

## 2023-07-06 MED ORDER — SODIUM CHLORIDE 0.9% IV SOLUTION: Freq: Once | INTRAVENOUS | Status: DC

## 2023-07-06 MED ORDER — ONDANSETRON HCL 4 MG PO TABS: 4.0000 mg | ORAL_TABLET | Freq: Four times a day (QID) | ORAL | Status: DC | PRN

## 2023-07-06 MED ORDER — DIPHENHYDRAMINE HCL 25 MG PO CAPS: 25.0000 mg | ORAL_CAPSULE | Freq: Every evening | ORAL | Status: DC | PRN

## 2023-07-06 NOTE — Progress Notes (Signed)
   07/06/23 1917  TOC Brief Assessment  Insurance and Status Reviewed  Patient has primary care physician Yes  Home environment has been reviewed From home  Prior level of function: Minimal assist, mostly c/meals  Prior/Current Home Services No current home services  Social Drivers of Health Review SDOH reviewed no interventions necessary  Readmission risk has been reviewed Yes  Transition of care needs no transition of care needs at this time   Transition of Care Department [facility]) has reviewed patient and no other TOC needs have been identified at this time. We will continue to monitor patient advancement through interdisciplinary progression rounds. If new patient needs arise, please place a TOC consult.

## 2023-07-06 NOTE — ED Provider Notes (Signed)
 [place] EMERGENCY DEPARTMENT AT [facility] Provider Note  CSN: [id number] Arrival date & time: 07/06/23 1010  Chief Complaint(s) No chief complaint on file.  HPI [name] is a 89 y.o. female with past medical history as below, significant for CAD, DM, hypertension who presents to the ED with complaint of abnormal lab  Patient reports that she had routine labs drawn at PCPs office, was advised to come to the ER urgently for evaluation.  She is unsure what lab test was abnormal.  She is asymptomatic, she reports that she feels normal.  She is compliant with home medications.  No fevers, chills, abdominal pain, chest pain, nausea or vomiting, change in bowel or bladder function, no melena or BRBPR, no rashes, no significant weight changes    Past Medical History Past Medical History:  Diagnosis Date   Acid reflux    Arthritis    Burning with urination 11/27/2013   Coronary artery disease    Diabetes mellitus    Glaucoma (increased eye pressure)    Hematuria 11/27/2013   Hypertension    Superficial fungus infection of skin 11/27/2013   Patient Active Problem List   Diagnosis Date Noted   Hematuria 11/27/2013   Burning with urination 11/27/2013   Superficial fungus infection of skin 11/27/2013   OA (osteoarthritis) of knee 07/24/2012   Home Medication(s) Prior to Admission medications   Medication Sig Start Date End Date Taking? Authorizing Provider  ALPRAZolam (XANAX) 0.25 MG tablet Take 0.25 mg by mouth daily as needed for anxiety. 10/01/20   [provider]  cephALEXin  (KEFLEX ) 500 MG capsule Take 1 capsule (500 mg total) by mouth 4 (four) times daily. Patient not taking: Reported on 07/28/2021 12/24/20   Nanavati, Ankit, MD  gabapentin (NEURONTIN) 600 MG tablet Take 600 mg by mouth 3 (three) times daily.      [provider]  hydrocortisone  cream 1 % Apply to affected area 2 times daily Patient not taking: Reported on 12/24/2020 09/23/19   Jerri Morale A, FNP  lisinopril (PRINIVIL,ZESTRIL) 20 MG tablet Take 20 mg by mouth daily.      [provider]  metFORMIN (GLUCOPHAGE-XR) 500 MG 24 hr tablet Take 500 mg by mouth 2 (two) times daily. 06/29/21   [provider]  nystatin  (MYCOSTATIN /NYSTOP ) 100000 UNIT/GM POWD Use 2-3 x daily to affected area prn Patient not taking: Reported on 09/17/2014 11/27/13   Lendia Quay A, NP  potassium chloride (KLOR-CON) 20 MEQ packet Take 20 mEq by mouth 2 (two) times daily.    [provider]  simvastatin (ZOCOR) 20 MG tablet Take 20 mg by mouth daily.     [provider]  torsemide (DEMADEX) 20 MG tablet Take 20 mg by mouth daily. 11/03/20   [provider]  verapamil (CALAN) 120 MG tablet  02/22/22   [provider]  Past Surgical History Past Surgical History:  Procedure Laterality Date   ABDOMINAL HYSTERECTOMY     APPENDECTOMY     CHOLECYSTECTOMY     Family History Family History  Problem Relation Age of Onset   Heart disease Father    Kidney disease Father        kidney failure   Diabetes Brother    Other Sister        cardiac arrest   Cancer Sister        lung   Diabetes Sister    Cancer Brother        lung   Alcohol abuse Brother    Alcohol abuse Brother     Social History Social History   Tobacco Use   Smoking status: Never   Smokeless tobacco: Never  Vaping Use   Vaping status: Never Used  Substance Use Topics   Alcohol use: No   Drug use: No   Allergies Patient has no known allergies.  Review of Systems A thorough review of systems was obtained and all systems are negative except as noted in the HPI and PMH.   Physical Exam Vital Signs  I have reviewed the triage vital signs BP 123/76 (BP Location: Left Arm)   Pulse 85   Temp 98.3 F (36.8 C) (Oral)   Resp 18   Ht 5\' 3"  (1.6 m)    Wt 72.6 kg   SpO2 96%   BMI 28.34 kg/m  Physical Exam Vitals and nursing note reviewed.  Constitutional:      General: She is not in acute distress.    Appearance: Normal appearance.  HENT:     Head: Normocephalic and atraumatic.     Right Ear: External ear normal.     Left Ear: External ear normal.     Nose: Nose normal.     Mouth/Throat:     Mouth: Mucous membranes are moist.  Eyes:     General: No scleral icterus.       Right eye: No discharge.        Left eye: No discharge.  Cardiovascular:     Rate and Rhythm: Normal rate and regular rhythm.     Pulses: Normal pulses.     Heart sounds: Murmur heard.  Pulmonary:     Effort: Pulmonary effort is normal. No respiratory distress.     Breath sounds: Normal breath sounds. No stridor.  Abdominal:     General: Abdomen is flat. There is no distension.     Palpations: Abdomen is soft.     Tenderness: There is no abdominal tenderness.  Musculoskeletal:     Cervical back: No rigidity.     Right lower leg: Edema present.     Left lower leg: Edema present.  Skin:    General: Skin is warm and dry.     Capillary Refill: Capillary refill takes less than 2 seconds.  Neurological:     Mental Status: She is alert.  Psychiatric:        Mood and Affect: Mood normal.        Behavior: Behavior normal. Behavior is cooperative.     ED Results and Treatments Labs (all labs ordered are listed, but only abnormal results are displayed) Labs Reviewed  CBC WITH DIFFERENTIAL/PLATELET - Abnormal; Notable for the following components:      Result Value   RBC 3.01 (*)    Hemoglobin 6.3 (*)    HCT 21.3 (*)    MCV 70.8 (*)  MCH 20.9 (*)    MCHC 29.6 (*)    RDW 18.7 (*)    All other components within normal limits  COMPREHENSIVE METABOLIC PANEL WITH GFR - Abnormal; Notable for the following components:   BUN 27 (*)    Albumin 3.2 (*)    AST 14 (*)    GFR, Estimated 54 (*)    All other components within normal limits  BRAIN  NATRIURETIC PEPTIDE - Abnormal; Notable for the following components:   B Natriuretic Peptide 429.0 (*)    All other components within normal limits  RETICULOCYTES - Abnormal; Notable for the following components:   RBC. 3.04 (*)    Retic Count, Absolute 15.2 (*)    Immature Retic Fract 16.1 (*)    All other components within normal limits  MAGNESIUM  VITAMIN B12  FOLATE  IRON AND TIBC  FERRITIN  POC OCCULT BLOOD, ED  TYPE AND SCREEN  PREPARE RBC (CROSSMATCH)                                                                                                                          Radiology No results found.  Pertinent labs & imaging results that were available during my care of the patient were reviewed by me and considered in my medical decision making (see MDM for details).  Medications Ordered in ED Medications  pantoprazole (PROTONIX) injection 40 mg (has no administration in time range)  0.9 %  sodium chloride  infusion (Manually program via Guardrails IV Fluids) (has no administration in time range)  furosemide (LASIX) injection 20 mg (has no administration in time range)                                                                                                                                     Procedures .Critical Care  Performed by: Teddi Favors, DO Authorized by: Teddi Favors, DO   Critical care provider statement:    Critical care time (minutes):  32   Critical care time was exclusive of:  Separately billable procedures and treating other patients   Critical care was necessary to treat or prevent imminent or life-threatening deterioration of the following conditions:  Circulatory failure   Critical care was time spent personally by me on the following activities:  Development of treatment plan with patient or surrogate, discussions with consultants, evaluation of patient's response to treatment, examination  of patient, ordering and review of laboratory  studies, ordering and review of radiographic studies, ordering and performing treatments and interventions, pulse oximetry, re-evaluation of patient's condition, review of old charts and obtaining history from patient or surrogate   Care discussed with: admitting provider     (including critical care time)  Medical Decision Making / ED Course    Medical Decision Making:    [name] is a 89 y.o. female  with past medical history as below, significant for CAD, DM, hypertension who presents to the ED with complaint of abnormal lab. The complaint involves an extensive differential diagnosis and also carries with it a high risk of complications and morbidity.  Serious etiology was considered.  Complete initial physical exam performed, notably the patient was in no distress, reports that she feels normal/ at her baseline.    Reviewed and confirmed nursing documentation for past medical history, family history, social history.  Vital signs reviewed.     Brief summary:  89 year old female history as above here request PCP office for abnormal labs.  She has no symptoms, reports that she feels normal.  Clinical Course as of 07/06/23 1215  Fri Jul 06, 2023  1113 Spoke with Dr Ronnald Coil office, reports her hgb was 6.2. baseline around 11 but hgb was around 9 four months ago. No thinners  [SG]  1211 Rectal exam w/ chaperone STEELE RN, brown stool in rectal vault, no frank blood, FOBT negative [SG]  1212 Hemoglobin(!!): 6.3 [SG]  1212 MCV(!): 70.8 Microcytic anemia [SG]    Clinical Course User Index [SG] Teddi Favors, DO     Lab reviewed, for microcytic anemia.  Hemoglobin today 6.3, prior was 9.  Hemoccult was negative.  Send anemia panel.  Consented for blood transfusion.  Will order 2 units, give Lasix in between given elevated BNP.  No respiratory complaints.  She does have chronic peripheral edema not significantly changed from prior.  Will admit, pt/family  agreeable.            Additional history obtained: -Additional history obtained from family -External records from outside source obtained and reviewed including: Chart review including previous notes, labs, imaging, consultation notes including  Primary care documentation Prior labs    Lab Tests: -I ordered, reviewed, and interpreted labs.   The pertinent results include:   Labs Reviewed  CBC WITH DIFFERENTIAL/PLATELET - Abnormal; Notable for the following components:      Result Value   RBC 3.01 (*)    Hemoglobin 6.3 (*)    HCT 21.3 (*)    MCV 70.8 (*)    MCH 20.9 (*)    MCHC 29.6 (*)    RDW 18.7 (*)    All other components within normal limits  COMPREHENSIVE METABOLIC PANEL WITH GFR - Abnormal; Notable for the following components:   BUN 27 (*)    Albumin 3.2 (*)    AST 14 (*)    GFR, Estimated 54 (*)    All other components within normal limits  BRAIN NATRIURETIC PEPTIDE - Abnormal; Notable for the following components:   B Natriuretic Peptide 429.0 (*)    All other components within normal limits  RETICULOCYTES - Abnormal; Notable for the following components:   RBC. 3.04 (*)    Retic Count, Absolute 15.2 (*)    Immature Retic Fract 16.1 (*)    All other components within normal limits  MAGNESIUM  VITAMIN B12  FOLATE  IRON AND TIBC  FERRITIN  POC OCCULT BLOOD,  ED  TYPE AND SCREEN  PREPARE RBC (CROSSMATCH)    Notable for as above  EKG   EKG Interpretation Date/Time:    Ventricular Rate:    PR Interval:    QRS Duration:    QT Interval:    QTC Calculation:   R Axis:      Text Interpretation:           Imaging Studies ordered: na   Medicines ordered and prescription drug management: Meds ordered this encounter  Medications   pantoprazole (PROTONIX) injection 40 mg   DISCONTD: 0.9 %  sodium chloride  infusion (Manually program via Guardrails IV Fluids)   0.9 %  sodium chloride  infusion (Manually program via Guardrails IV Fluids)    furosemide (LASIX) injection 20 mg    -I have reviewed the patients home medicines and have made adjustments as needed   Consultations Obtained: I requested consultation with the hospitalist,  and discussed lab and imaging findings as well as pertinent plan    Cardiac Monitoring: Continuous pulse oximetry interpreted by myself, 97% on RA.    Social Determinants of Health:  Diagnosis or treatment significantly limited by social determinants of health: na   Reevaluation: After the interventions noted above, I reevaluated the patient and found that they have stayed the same  Co morbidities that complicate the patient evaluation  Past Medical History:  Diagnosis Date   Acid reflux    Arthritis    Burning with urination 11/27/2013   Coronary artery disease    Diabetes mellitus    Glaucoma (increased eye pressure)    Hematuria 11/27/2013   Hypertension    Superficial fungus infection of skin 11/27/2013      Dispostion: Disposition decision including need for hospitalization was considered, and patient admitted to the hospital.    Final Clinical Impression(s) / ED Diagnoses Final diagnoses:  Microcytic anemia        Teddi Favors, DO 07/06/23 1215

## 2023-07-06 NOTE — ED Triage Notes (Signed)
 Pt had blood work yesterday and PCP(Dr. Eldon Greenland) sent here.  Pt's son is not aware of what was abnormal.   Pt denies any SOB or pain.

## 2023-07-06 NOTE — H&P (Signed)
 History and Physical    [name] NWG:[id number] DOB: 1931/07/24 DOA: 07/06/2023  PCP: Wyvonna Heidelberg, MD   Patient coming from: [name]  Chief complaints: Abnormal labs    HPI:89 y.o. female who lives  with her son and with medical history of CAD, DM, hypertension who presented to the ED with complaint of abnormal labs Patient reports that she had routine labs drawn at PCPs office, was advised to come to the ER urgently for evaluation.  She is unsure what lab test was abnormal.  She is asymptomatic, she reports that she feels normal.  She is compliant with home medications.  No fevers, chills, abdominal pain, chest pain, nausea or vomiting, change in bowel or bladder function, no melena or BRBPR, no rashes, no significant weight changes Patient otherwise denies any nausea, vomiting, chest pain, shortness of breath, fever, chills, headache, focal weakness, numbness tingling, speech difficulties  In the [id number] hemodynamically stable Labs were obtained CMP unremarkable although albumin 3.2,BNP 429, hemoglobin low 6.3 g MCV 70, anemia panel with low iron at 15 ferritin 6 Hemoccult test was negative.  No other prior labs. Chest x-ray obtained result pending. 2 units prbc was ordered and admission requested.   Assessment/Plan Principal Problem:   Iron deficiency anemia   Assessment and plan:  Severe microcytic iron deficiency anemia : Hemoglobin 6.3 g anemia panel shows saturation 13 ferritin 6 folate 15 B12 236. 2 units PRBC being transferred, will add iron folate B12 supplement in addition Hemoccult test is negative. She will need close follow-up with PCP.  CAD: Patient has no chest pain.  Resume home statin torsemide  DM: Blood sugar remains stable in 90s.  Add sliding scale, hold metformin.  Hypertension: BP is well-controlled.  Continue home torsemide hold verapamil lisinopril for now Severity of Illness: The appropriate patient status for this patient is  OBSERVATION. Observation status is judged to be reasonable and necessary in order to provide the required intensity of service to ensure the patient's safety. The patient's presenting symptoms, physical exam findings, and initial radiographic and laboratory data in the context of their medical condition is felt to place them at decreased risk for further clinical deterioration. Furthermore, it is anticipated that the patient will be medically stable for discharge from the hospital within 2 midnights of admission.    DVT prophylaxis: SCDs Start: 07/06/23 1417 Code Status:   Code Status: Full Code  Family Communication: Admission, patients condition and plan of care including tests being ordered have been discussed with the patient  who indicate understanding and agree with the plan and Code Status.  Consults called:  none  Review of Systems: All systems were reviewed and were negative except as mentioned in HPI above. Negative for fever Negative for chest pain Negative for shortness of breath  Past Medical History:  Diagnosis Date   Acid reflux    Arthritis    Burning with urination 11/27/2013   Coronary artery disease    Diabetes mellitus    Glaucoma (increased eye pressure)    Hematuria 11/27/2013   Hypertension    Superficial fungus infection of skin 11/27/2013    Past Surgical History:  Procedure Laterality Date   ABDOMINAL HYSTERECTOMY     APPENDECTOMY     CHOLECYSTECTOMY       reports that she has never smoked. She has never used smokeless tobacco. She reports that she does not drink alcohol and does not use drugs.  No Known Allergies  Family History  Problem Relation  Age of Onset   Heart disease Father    Kidney disease Father        kidney failure   Diabetes Brother    Other Sister        cardiac arrest   Cancer Sister        lung   Diabetes Sister    Cancer Brother        lung   Alcohol abuse Brother    Alcohol abuse Brother      Prior to Admission  medications   Medication Sig Start Date End Date Taking? Authorizing Provider  ALPRAZolam (XANAX) 0.25 MG tablet Take 0.25 mg by mouth daily as needed for anxiety. 10/01/20   [provider]  cephALEXin  (KEFLEX ) 500 MG capsule Take 1 capsule (500 mg total) by mouth 4 (four) times daily. Patient not taking: Reported on 07/28/2021 12/24/20   Nanavati, Ankit, MD  gabapentin (NEURONTIN) 600 MG tablet Take 600 mg by mouth 3 (three) times daily.      [provider]  hydrocortisone  cream 1 % Apply to affected area 2 times daily Patient not taking: Reported on 12/24/2020 09/23/19   Jerri Morale A, FNP  lisinopril (PRINIVIL,ZESTRIL) 20 MG tablet Take 20 mg by mouth daily.      [provider]  metFORMIN (GLUCOPHAGE-XR) 500 MG 24 hr tablet Take 500 mg by mouth 2 (two) times daily. 06/29/21   [provider]  nystatin  (MYCOSTATIN /NYSTOP ) 100000 UNIT/GM POWD Use 2-3 x daily to affected area prn Patient not taking: Reported on 09/17/2014 11/27/13   Lendia Quay A, NP  potassium chloride (KLOR-CON) 20 MEQ packet Take 20 mEq by mouth 2 (two) times daily.    [provider]  simvastatin (ZOCOR) 20 MG tablet Take 20 mg by mouth daily.     [provider]  torsemide (DEMADEX) 20 MG tablet Take 20 mg by mouth daily. 11/03/20   [provider]  verapamil (CALAN) 120 MG tablet  02/22/22   [provider]    Physical Exam: Vitals:   07/06/23 1056 07/06/23 1058  BP:  123/76  Pulse:  85  Resp:  18  Temp:  98.3 F (36.8 C)  TempSrc:  Oral  SpO2:  96%  Weight: 72.6 kg   Height: 5\' 3"  (1.6 m)    General exam: AAO, pleasant, elderly HEENT:Oral mucosa moist, Ear/Nose WNL grossly, dentition normal. Respiratory system: bilaterally clear,no wheezing or crackles,no use of accessory muscle Cardiovascular system: S1 & S2 +, No JVD,. Gastrointestinal system: Abdomen soft, NT,ND, BS+ Nervous System:Alert, awake, moving extremities and grossly  nonfocal Extremities: Non pittting ankle edema, distal peripheral pulses palpable.  Skin: No rashes,no icterus. MSK: Normal muscle bulk,tone, power   Labs on Admission: I have personally reviewed following labs and imaging studies  CBC: Recent Labs  Lab 07/06/23 1131  WBC 5.7  NEUTROABS 3.5  HGB 6.3*  HCT 21.3*  MCV 70.8*  PLT 282   Basic Metabolic Panel: Recent Labs  Lab 07/06/23 1131  NA 139  K 4.6  CL 105  CO2 24  GLUCOSE 97  BUN 27*  CREATININE 0.99  CALCIUM 9.3  MG 2.0   Estimated Creatinine Clearance: 36.1 mL/min (by C-G formula based on SCr of 0.99 mg/dL). Recent Labs  Lab 07/06/23 1131  AST 14*  ALT 8  ALKPHOS 88  BILITOT 0.7  PROT 6.5  ALBUMIN 3.2*  No results for input(s): "TSH", "T4TOTAL", "FREET4", "T3FREE", "THYROIDAB" in the last 72 hours. Urine analysis:  Component Value Date/Time   COLORURINE YELLOW 11/27/2013 1239   APPEARANCEUR CLEAR 11/27/2013 1239   LABSPEC 1.018 11/27/2013 1239   PHURINE 5.5 11/27/2013 1239   GLUCOSEU NEG 11/27/2013 1239   HGBUR NEG 11/27/2013 1239   BILIRUBINUR NEG 11/27/2013 1239   KETONESUR NEG 11/27/2013 1239   PROTEINUR NEG 11/27/2013 1239   PROTEINUR neg 11/27/2013 1216   UROBILINOGEN 0.2 11/27/2013 1239   NITRITE NEG 11/27/2013 1239   NITRITE neg 11/27/2013 1216   LEUKOCYTESUR MOD (A) 11/27/2013 1239  Radiological Exams on Admission: DG Chest 2 View Result Date: 07/06/2023 CLINICAL DATA:  89 year old female with cough EXAM: CHEST - 2 VIEW COMPARISON:  11/28/2011 FINDINGS: Cardiomediastinal silhouette unchanged in size and contour Similar number and distribution of small partially calcified granulomas of the lungs. No interlobular septal thickening. No pneumothorax or pleural effusion. No confluent airspace disease. Degenerative changes spine.  No displaced fracture IMPRESSION: No radiographic evidence of acute cardiopulmonary disease Electronically Signed   By: Myrlene Asper D.O.   On: 07/06/2023 13:47    Lesa Rape MD [facility]  If 7PM-7AM, please contact night-coverage [email or web address]  07/06/2023, 2:17 PM

## 2023-07-06 NOTE — Care Management Obs Status (Signed)
 MEDICARE OBSERVATION STATUS NOTIFICATION   Patient Details  Name: [name] MRN: [id number] Date of Birth: [birth year]   Medicare Observation Status Notification Given:  Yes    Geraldina Klinefelter, RN 07/06/2023, 6:08 PM

## 2023-07-06 NOTE — Progress Notes (Signed)
 Pt arrived to room 338 via stretcher from ED. Pt able to stand with standby assist and take 3 steps to bed from stretcher, then able to stand on foot scale for weight. Pt A&O, denies c/o dizziness, SOB or pain. Pt assisted into bed. Bed alarm on, bed in low position, call bell within reach. Oriented to room and safety procedures, advised to call for needs, pt states understanding.

## 2023-07-06 NOTE — Plan of Care (Signed)

## 2023-07-06 NOTE — Hospital Course (Addendum)
 89 y.o. female who lives  with her son and with medical history of CAD, DM, hypertension who presented to the ED with complaint of abnormal labs Patient reports that she had routine labs drawn at PCPs office, was advised to come to the ER urgently for evaluation.  She is unsure what lab test was abnormal.  She is asymptomatic, she reports that she feels normal.  She is compliant with home medications.  No fevers, chills, abdominal pain, chest pain, nausea or vomiting, change in bowel or bladder function, no melena or BRBPR, no rashes, no significant weight changes Patient otherwise denies any nausea, vomiting, chest pain, shortness of breath, fever, chills, headache, focal weakness, numbness tingling, speech difficulties  In the [id number] hemodynamically stable Labs were obtained CMP unremarkable although albumin 3.2,BNP 429, hemoglobin low 6.3 g MCV 70, anemia panel with low iron at 15 ferritin 6 Hemoccult test was negative.  No other prior labs. Chest x-ray obtained result pending. 2 units prbc was ordered and admission requested.

## 2023-07-07 DIAGNOSIS — D509 Iron deficiency anemia, unspecified: Secondary | ICD-10-CM | POA: Diagnosis not present

## 2023-07-07 LAB — BASIC METABOLIC PANEL WITH GFR
Anion gap: 8 (ref 5–15)
BUN: 23 mg/dL (ref 8–23)
CO2: 24 mmol/L (ref 22–32)
Calcium: 8.7 mg/dL — ABNORMAL LOW (ref 8.9–10.3)
Chloride: 106 mmol/L (ref 98–111)
Creatinine, Ser: 0.79 mg/dL (ref 0.44–1.00)
GFR, Estimated: >60 mL/min (ref >60)
Glucose, Bld: 89 mg/dL (ref 70–99)
Potassium: 3.5 mmol/L (ref 3.5–5.1)
Sodium: 138 mmol/L (ref 135–145)

## 2023-07-07 LAB — CBC
HCT: 26.4 % — ABNORMAL LOW (ref 36.0–46.0)
Hemoglobin: 7.9 g/dL — ABNORMAL LOW (ref 12.0–15.0)
MCH: 22.4 pg — ABNORMAL LOW (ref 26.0–34.0)
MCHC: 29.9 g/dL — ABNORMAL LOW (ref 30.0–36.0)
MCV: 74.8 fL — ABNORMAL LOW (ref 80.0–100.0)
Platelets: 236 10*3/uL (ref 150–400)
RBC: 3.53 MIL/uL — ABNORMAL LOW (ref 3.87–5.11)
RDW: 20.4 % — ABNORMAL HIGH (ref 11.5–15.5)
WBC: 7.5 10*3/uL (ref 4.0–10.5)
nRBC: 0 % (ref 0.0–0.2)

## 2023-07-07 LAB — GLUCOSE, CAPILLARY
Glucose-Capillary: 85 mg/dL (ref 70–99)
Glucose-Capillary: 109 mg/dL — ABNORMAL HIGH (ref 70–99)
Glucose-Capillary: 118 mg/dL — ABNORMAL HIGH (ref 70–99)
Glucose-Capillary: 104 mg/dL — ABNORMAL HIGH (ref 70–99)

## 2023-07-07 LAB — PREPARE RBC (CROSSMATCH)

## 2023-07-07 LAB — HEMOGLOBIN AND HEMATOCRIT, BLOOD
HCT: 32.6 % — ABNORMAL LOW (ref 36.0–46.0)
Hemoglobin: 10.2 g/dL — ABNORMAL LOW (ref 12.0–15.0)

## 2023-07-07 MED ORDER — ACETAMINOPHEN 325 MG PO TABS
650.0000 mg | ORAL_TABLET | Freq: Four times a day (QID) | ORAL | Status: DC
  Administered 2023-07-07 – 2023-07-08 (×4): 650 mg via ORAL
  Filled 2023-07-07 (×4): qty 2

## 2023-07-07 MED ORDER — IRON SUCROSE 300 MG IVPB - SIMPLE MED
300.0000 mg | Freq: Once | Status: AC
  Administered 2023-07-07: 300 mg via INTRAVENOUS
  Filled 2023-07-07: qty 300

## 2023-07-07 MED ORDER — CYANOCOBALAMIN 1000 MCG/ML IJ SOLN
1000.0000 ug | Freq: Once | INTRAMUSCULAR | Status: AC
  Administered 2023-07-07: 1000 ug via INTRAMUSCULAR
  Filled 2023-07-07: qty 1

## 2023-07-07 MED ORDER — SODIUM CHLORIDE 0.9% IV SOLUTION: Freq: Once | INTRAVENOUS | Status: AC

## 2023-07-07 NOTE — Plan of Care (Signed)
  Problem: Clinical Measurements: Goal: Will remain free from infection Outcome: Progressing   Problem: Nutritional: Goal: Maintenance of adequate nutrition will improve Outcome: Progressing   Problem: Skin Integrity: Goal: Risk for impaired skin integrity will decrease Outcome: Progressing   Problem: Tissue Perfusion: Goal: Adequacy of tissue perfusion will improve Outcome: Progressing

## 2023-07-07 NOTE — Plan of Care (Signed)

## 2023-07-07 NOTE — Progress Notes (Signed)
 PROGRESS NOTE  Roberta CUDAHY ZOX:[id number] DOB: 1931/12/21   PCP: [name], [name], MD  Patient is from: Home.  Lives alone.  Has cane at home  DOA: 07/06/2023 LOS: 0  Chief complaints Abnormal labs at PCP office    Brief Narrative / Interim history: 89 year old F with PMH of CAD, HTN, DM, anemia, lower extremity edema and osteoarthritis advised to go to ED by PCP due to abnormal labs, and admitted with iron deficiency anemia.  Patient reports history of anemia and taking iron but she ran out of iron pills about a week ago.  She was completely asymptomatic.  Denies melena or hematochezia.  Reports taking ibuprofen occasionally.  She is not on anticoagulation.  In ED, stable vitals although she became slightly hypotensive later on.  Hgb 6.3 (was 9.1 in 02/2023 and 11 about a year prior).  Hemoccult negative x 2.  Cr 0.99.  BUN 27.  BNP 430.  Anemia panel with severe iron deficiency and borderline B12.  CXR without acute finding.  Transfused 2 units and admitted.  The next day, Hgb improved to 7.9.  Transfused additional 1 unit.  Also received IV Venofer 300 mg x 1 and B12 injection.  Subjective: Seen and examined earlier this morning.  No major events overnight of this morning.  Denies melena or hematochezia.  Denies chest pain, dyspnea or fatigue.  She has chronic lower extremity edema for which she takes Demadex.  Objective: Vitals:   07/07/23 0955 07/07/23 0957 07/07/23 1012 07/07/23 1206  BP: 118/60 118/60 (!) 116/57 (!) 120/52  Pulse: 84 84 81 77  Resp:  14 16   Temp: 98.8 F (37.1 C) 98.8 F (37.1 C) 98.7 F (37.1 C) 99.2 F (37.3 C)  TempSrc: Oral Oral  Oral  SpO2: 100%   100%  Weight:      Height:        Examination:  GENERAL: No apparent distress.  Nontoxic. HEENT: MMM.  Vision and hearing grossly intact.  NECK: Supple.  No apparent JVD.  RESP:  No IWOB.  Fair aeration bilaterally. CVS:  RRR. Heart sounds normal.  ABD/GI/GU: BS+. Abd soft, NTND.   MSK/EXT:  Moves extremities. No apparent deformity.  1+ BLE edema. SKIN: no apparent skin lesion or wound NEURO: Awake, alert and oriented appropriately.  No apparent focal neuro deficit. PSYCH: Calm. Normal affect.   Consultants:  None  Procedures: None  Microbiology summarized: None  Assessment and plan: Severe microcytic iron deficiency anemia: Denies melena or hematochezia.  Reports taking 1 tablet of ibuprofen occasionally.  Not on anticoagulations.  Hgb improved to 7.9 after 2 units Recent Labs    03/03/23 1125 07/06/23 1131 07/06/23 2241 07/07/23 0500  HGB 9.1* 6.3* 7.8* 7.9*  - Transfuse additional 1 unit - IV Venofer 300 mg x 1 - B12 injection 1000 mcg x 1 - Monitor H&H.  Goal Hgb > 8.0 given history of CAD -Advised to avoid NSAIDs.   He is of CAD: No cardiopulmonary symptoms.  Not on antiplatelets -Hold lisinopril given soft blood pressure  Osteoarthritis - Scheduled Tylenol  - PT/OT eval  BLE edema: On Demadex at home. - Hold Demadex given soft blood pressure - Leg elevation   Body mass index is 28.34 kg/m.          DVT prophylaxis:  SCDs Start: 07/06/23 1417  Code Status: Full code Family Communication: None at bedside Level of care: Telemetry Status is: Observation The patient will require care spanning > 2 midnights and  should be moved to inpatient because: Severe iron deficiency anemia   Final disposition: Home   35 minutes with more than 50% spent in reviewing records, counseling patient/family and coordinating care.   Sch Meds:  Scheduled Meds:  ferrous sulfate  325 mg Oral BID WC   folic acid  1 mg Oral Daily   insulin aspart  0-6 Units Subcutaneous TID WC   vitamin B-12  500 mcg Oral Daily   Continuous Infusions:  iron sucrose     PRN Meds:.acetaminophen  **OR** acetaminophen , diphenhydrAMINE, ondansetron **OR** ondansetron (ZOFRAN) IV  Antimicrobials: Anti-infectives (From admission, onward)    None        I have  personally reviewed the following labs and images: CBC: Recent Labs  Lab 07/06/23 1131 07/06/23 2241 07/07/23 0500  WBC 5.7  --  7.5  NEUTROABS 3.5  --   --   HGB 6.3* 7.8* 7.9*  HCT 21.3* 24.6* 26.4*  MCV 70.8*  --  74.8*  PLT 282  --  236   BMP &GFR Recent Labs  Lab 07/06/23 1131 07/07/23 0500  NA 139 138  K 4.6 3.5  CL 105 106  CO2 24 24  GLUCOSE 97 89  BUN 27* 23  CREATININE 0.99 0.79  CALCIUM 9.3 8.7*  MG 2.0  --    Estimated Creatinine Clearance: 44.6 mL/min (by C-G formula based on SCr of 0.79 mg/dL). Liver & Pancreas: Recent Labs  Lab 07/06/23 1131  AST 14*  ALT 8  ALKPHOS 88  BILITOT 0.7  PROT 6.5  ALBUMIN 3.2*   No results for input(s): "LIPASE", "AMYLASE" in the last 168 hours. No results for input(s): "AMMONIA" in the last 168 hours. Diabetic: Recent Labs    07/06/23 1131  HGBA1C 5.3   Recent Labs  Lab 07/06/23 1616 07/06/23 2112 07/07/23 0735 07/07/23 1121  GLUCAP 76 104* 85 109*   Cardiac Enzymes: No results for input(s): "CKTOTAL", "CKMB", "CKMBINDEX", "TROPONINI" in the last 168 hours. No results for input(s): "PROBNP" in the last 8760 hours. Coagulation Profile: No results for input(s): "INR", "PROTIME" in the last 168 hours. Thyroid  Function Tests: No results for input(s): "TSH", "T4TOTAL", "FREET4", "T3FREE", "THYROIDAB" in the last 72 hours. Lipid Profile: No results for input(s): "CHOL", "HDL", "LDLCALC", "TRIG", "CHOLHDL", "LDLDIRECT" in the last 72 hours. Anemia Panel: Recent Labs    07/06/23 1131  VITAMINB12 236  FOLATE 15.3  FERRITIN 6*  TIBC 444  IRON 15*  RETICCTPCT 0.5   Urine analysis:    Component Value Date/Time   COLORURINE YELLOW 11/27/2013 1239   APPEARANCEUR CLEAR 11/27/2013 1239   LABSPEC 1.018 11/27/2013 1239   PHURINE 5.5 11/27/2013 1239   GLUCOSEU NEG 11/27/2013 1239   HGBUR NEG 11/27/2013 1239   BILIRUBINUR NEG 11/27/2013 1239   KETONESUR NEG 11/27/2013 1239   PROTEINUR NEG 11/27/2013 1239    PROTEINUR neg 11/27/2013 1216   UROBILINOGEN 0.2 11/27/2013 1239   NITRITE NEG 11/27/2013 1239   NITRITE neg 11/27/2013 1216   LEUKOCYTESUR MOD (A) 11/27/2013 1239   Sepsis Labs: Invalid input(s): "PROCALCITONIN", "LACTICIDVEN"  Microbiology: No results found for this or any previous visit (from the past 240 hours).  Radiology Studies: No results found.    Valerian Jewel T. Jamicheal Heard [facility]  If 7PM-7AM, please contact night-coverage [email or web address] 07/07/2023, 1:51 PM

## 2023-07-08 DIAGNOSIS — D509 Iron deficiency anemia, unspecified: Secondary | ICD-10-CM | POA: Diagnosis not present

## 2023-07-08 DIAGNOSIS — R011 Cardiac murmur, unspecified: Secondary | ICD-10-CM | POA: Diagnosis not present

## 2023-07-08 LAB — CBC
HCT: 30.1 % — ABNORMAL LOW (ref 36.0–46.0)
Hemoglobin: 9.1 g/dL — ABNORMAL LOW (ref 12.0–15.0)
MCH: 23.1 pg — ABNORMAL LOW (ref 26.0–34.0)
MCHC: 30.2 g/dL (ref 30.0–36.0)
MCV: 76.4 fL — ABNORMAL LOW (ref 80.0–100.0)
Platelets: 230 10*3/uL (ref 150–400)
RBC: 3.94 MIL/uL (ref 3.87–5.11)
RDW: 22.3 % — ABNORMAL HIGH (ref 11.5–15.5)
WBC: 8.6 10*3/uL (ref 4.0–10.5)
nRBC: 0 % (ref 0.0–0.2)

## 2023-07-08 LAB — TYPE AND SCREEN
ABO/RH(D): O POS
Antibody Screen: NEGATIVE
Unit division: 0
Unit division: 0
Unit division: 0

## 2023-07-08 LAB — BPAM RBC
Blood Product Expiration Date: 202506092359
Blood Product Expiration Date: 202506092359
Blood Product Expiration Date: 202506092359
ISSUE DATE / TIME: 202505100940
ISSUE DATE / TIME: 202505091514
ISSUE DATE / TIME: 202505091840
Unit Type and Rh: 5100
Unit Type and Rh: 5100
Unit Type and Rh: 5100

## 2023-07-08 LAB — RENAL FUNCTION PANEL
Albumin: 2.6 g/dL — ABNORMAL LOW (ref 3.5–5.0)
Anion gap: 10 (ref 5–15)
BUN: 18 mg/dL (ref 8–23)
CO2: 22 mmol/L (ref 22–32)
Calcium: 9 mg/dL (ref 8.9–10.3)
Chloride: 105 mmol/L (ref 98–111)
Creatinine, Ser: 0.85 mg/dL (ref 0.44–1.00)
GFR, Estimated: >60 mL/min (ref >60)
Glucose, Bld: 88 mg/dL (ref 70–99)
Phosphorus: 2.5 mg/dL (ref 2.5–4.6)
Potassium: 3.5 mmol/L (ref 3.5–5.1)
Sodium: 137 mmol/L (ref 135–145)

## 2023-07-08 LAB — MAGNESIUM: Magnesium: 1.9 mg/dL (ref 1.7–2.4)

## 2023-07-08 LAB — GLUCOSE, CAPILLARY: Glucose-Capillary: 96 mg/dL (ref 70–99)

## 2023-07-08 MED ORDER — VITAMIN B-12 1000 MCG PO TABS: 1000.0000 ug | ORAL_TABLET | Freq: Every day | ORAL | Status: AC

## 2023-07-08 MED ORDER — SENNOSIDES-DOCUSATE SODIUM 8.6-50 MG PO TABS: 1.0000 | ORAL_TABLET | Freq: Two times a day (BID) | ORAL | Status: AC | PRN

## 2023-07-08 MED ORDER — FERROUS SULFATE 325 (65 FE) MG PO TABS: 325.0000 mg | ORAL_TABLET | Freq: Every day | ORAL | Status: AC

## 2023-07-08 MED ORDER — PANTOPRAZOLE SODIUM 40 MG PO TBEC: 40.0000 mg | DELAYED_RELEASE_TABLET | Freq: Every day | ORAL | 0 refills | Status: DC

## 2023-07-08 NOTE — Plan of Care (Signed)
   Problem: Education: Goal: Knowledge of General Education information will improve Description: Including pain rating scale, medication(s)/side effects and non-pharmacologic comfort measures Outcome: Progressing   Problem: Health Behavior/Discharge Planning: Goal: Ability to manage health-related needs will improve Outcome: Progressing   Problem: Clinical Measurements: Goal: Will remain free from infection Outcome: Progressing

## 2023-07-08 NOTE — Discharge Summary (Signed)
 Physician Discharge Summary  [name]:[id number] DOB: November 16, 1931 DOA: 07/06/2023  PCP: [name], [name], MD  Admit date: 07/06/2023 Discharge date: 07/08/23  Admitted From: Home Disposition: Home Recommendations for Outpatient Follow-up:  Follow up with PCP in 1 week Check blood pressure, CMP and CBC at follow-up Please follow up on the following pending results: None  Home Health: No need identified Equipment/Devices: No need identified  Discharge Condition: Stable CODE STATUS: Full code  Follow-up Information     Wyvonna Heidelberg, MD. Schedule an appointment as soon as possible for a visit in 1 week(s).   Specialty: Internal Medicine Contact information: [address] [place] [address] [phone]                 Hospital course 89 year old F with PMH of CAD, HTN, DM, anemia, lower extremity edema and osteoarthritis advised to go to ED by PCP due to abnormal labs, and admitted with iron deficiency anemia.  Patient reports history of anemia and taking iron but she ran out of iron pills about a week ago.  She was completely asymptomatic.  Denies melena or hematochezia.  Reports taking ibuprofen occasionally.  She is not on anticoagulation.   In ED, stable vitals although she became slightly hypotensive later on.  Hgb 6.3 (was 9.1 in 02/2023 and 11 about a year prior).  Hemoccult negative x 2.  Cr 0.99.  BUN 27.  BNP 430.  Anemia panel with severe iron deficiency and borderline B12.  CXR without acute finding.  Transfused 2 units and admitted.   The next day, Hgb improved to 7.9.  Transfused additional 1 unit on 5/20.  Also received IV Venofer 300 mg x 1 and B12 injection.  On the day of discharge, hemoglobin improved to 9.1.  Felt well and ready to go home to celebrate Mother's Day.  She is discharged on p.o. iron and p.o. Protonix.  Evaluated by therapy and no need identified.  Advised to avoid NSAID.  Discharge plan discussed with  patient's son at bedside  See individual problem list below for more.   Problems addressed during this hospitalization Severe microcytic iron deficiency anemia: Denies melena or hematochezia.  Reports taking 1 tablet of ibuprofen occasionally.  Not on anticoagulations.  Transfused 3 units.  Hgb improved to 9.1. Recent Labs    03/03/23 1125 07/06/23 1131 07/06/23 2241 07/07/23 0500 07/07/23 1404 07/08/23 0500  HGB 9.1* 6.3* 7.8* 7.9* 10.2* 9.1*  -Received IV Venofer 300 mg x 1 and vitamin B12 injection 1000 mcg x 1 -Continue p.o. iron, B12 and p.o. Protonix -P.o. Senokot-S as needed for constipation -Advised to avoid NSAID -Repeat CBC in 1 to 2 weeks  Heart murmur: She had 3/6 SEM over RUSB and LUSB. No recent TTE.  Given age, she is not a candidate for intervention, hence no further evaluation.  She is now symptomatic later.   History of CAD: No cardiopulmonary symptoms.  Not on antiplatelets -Discontinue lisinopril given soft blood pressure.   Osteoarthritis -Tylenol . -Advised to avoid NSAID   BLE edema: On Demadex at home. -Continue home Demadex -Leg elevation         Time spent 35 minutes  Vital signs Vitals:   07/07/23 1206 07/07/23 1400 07/07/23 2205 07/08/23 0605  BP: (!) 120/52 117/61 (!) 119/57 118/63  Pulse: 77 82 78 72  Temp: 99.2 F (37.3 C) 99.4 F (37.4 C) 98.5 F (36.9 C) 98.3 F (36.8 C)  Resp:   20 20  Height:      Weight:      SpO2: 100% 100% 99% 99%  TempSrc: Oral Oral Oral Oral  BMI (Calculated):         Discharge exam  GENERAL: No apparent distress.  Nontoxic. HEENT: MMM.  Vision and hearing grossly intact.  NECK: Supple.  No apparent JVD.  RESP:  No IWOB.  Fair aeration bilaterally. CVS:  RRR.  3/6 SEM over RUSB and LUSB. ABD/GI/GU: BS+. Abd soft, NTND.  MSK/EXT:  Moves extremities. No apparent deformity.  1+ BLE edema. SKIN: no apparent skin lesion or wound NEURO: Awake and alert. Oriented appropriately.  No apparent focal  neuro deficit. PSYCH: Calm. Normal affect.   Discharge Instructions Discharge Instructions     Diet - low sodium heart healthy   Complete by: As directed    Discharge instructions   Complete by: As directed    It has been a pleasure taking care of you!  You were hospitalized anemia (low hemoglobin).  Your hemoglobin improved with blood transfusion.  You also received intravenous iron and vitamin B12 injection to help with anemia.  We strongly recommend you avoid over-the-counter pain medications other than plain Tylenol .  We have made some changes to your home medication during this hospitalization.  Please review your new medication list and the directions on your medications before you take them.  Follow-up with your primary care doctor in 1 to 2 weeks.    Take care,   Increase activity slowly   Complete by: As directed       Allergies as of 07/08/2023   No Known Allergies      Medication List     STOP taking these medications    lisinopril 20 MG tablet Commonly known as: ZESTRIL       TAKE these medications    cyanocobalamin 1000 MCG tablet Commonly known as: VITAMIN B12 Take 1 tablet (1,000 mcg total) by mouth daily. What changed:  medication strength how much to take   ferrous sulfate 325 (65 FE) MG tablet Take 1 tablet (325 mg total) by mouth daily with breakfast.   gabapentin 600 MG tablet Commonly known as: NEURONTIN Take 600 mg by mouth 3 (three) times daily.   metFORMIN 500 MG 24 hr tablet Commonly known as: GLUCOPHAGE-XR Take 500 mg by mouth 2 (two) times daily.   multivitamin tablet Take 1 tablet by mouth daily.   pantoprazole 40 MG tablet Commonly known as: Protonix Take 1 tablet (40 mg total) by mouth daily.   senna-docusate 8.6-50 MG tablet Commonly known as: Senokot-S Take 1 tablet by mouth 2 (two) times daily between meals as needed for mild constipation.   simvastatin 20 MG tablet Commonly known as: ZOCOR Take 20 mg by mouth  daily.   torsemide 20 MG tablet Commonly known as: DEMADEX Take 20 mg by mouth 2 (two) times daily.        Consultations: None  Procedures/Studies:   DG Chest 2 View Result Date: 07/06/2023 CLINICAL DATA:  89 year old female with cough EXAM: CHEST - 2 VIEW COMPARISON:  11/28/2011 FINDINGS: Cardiomediastinal silhouette unchanged in size and contour Similar number and distribution of small partially calcified granulomas of the lungs. No interlobular septal thickening. No pneumothorax or pleural effusion. No confluent airspace disease. Degenerative changes spine.  No displaced fracture IMPRESSION: No radiographic evidence of acute cardiopulmonary disease Electronically Signed   By: Myrlene Asper D.O.   On: 07/06/2023 13:47       The results of significant  diagnostics from this hospitalization (including imaging, microbiology, ancillary and laboratory) are listed below for reference.     Microbiology: No results found for this or any previous visit (from the past 240 hours).   Labs:  CBC: Recent Labs  Lab 07/06/23 1131 07/06/23 2241 07/07/23 0500 07/07/23 1404 07/08/23 0500  WBC 5.7  --  7.5  --  8.6  NEUTROABS 3.5  --   --   --   --   HGB 6.3* 7.8* 7.9* 10.2* 9.1*  HCT 21.3* 24.6* 26.4* 32.6* 30.1*  MCV 70.8*  --  74.8*  --  76.4*  PLT 282  --  236  --  230   BMP &GFR Recent Labs  Lab 07/06/23 1131 07/07/23 0500 07/08/23 0500  NA 139 138 137  K 4.6 3.5 3.5  CL 105 106 105  CO2 24 24 22   GLUCOSE 97 89 88  BUN 27* 23 18  CREATININE 0.99 0.79 0.85  CALCIUM 9.3 8.7* 9.0  MG 2.0  --  1.9  PHOS  --   --  2.5   Estimated Creatinine Clearance: 42 mL/min (by C-G formula based on SCr of 0.85 mg/dL). Liver & Pancreas: Recent Labs  Lab 07/06/23 1131 07/08/23 0500  AST 14*  --   ALT 8  --   ALKPHOS 88  --   BILITOT 0.7  --   PROT 6.5  --   ALBUMIN 3.2* 2.6*   No results for input(s): "LIPASE", "AMYLASE" in the last 168 hours. No results for input(s):  "AMMONIA" in the last 168 hours. Diabetic: Recent Labs    07/06/23 1131  HGBA1C 5.3   Recent Labs  Lab 07/07/23 0735 07/07/23 1121 07/07/23 1650 07/07/23 2232 07/08/23 0722  GLUCAP 85 109* 118* 104* 96   Cardiac Enzymes: No results for input(s): "CKTOTAL", "CKMB", "CKMBINDEX", "TROPONINI" in the last 168 hours. No results for input(s): "PROBNP" in the last 8760 hours. Coagulation Profile: No results for input(s): "INR", "PROTIME" in the last 168 hours. Thyroid  Function Tests: No results for input(s): "TSH", "T4TOTAL", "FREET4", "T3FREE", "THYROIDAB" in the last 72 hours. Lipid Profile: No results for input(s): "CHOL", "HDL", "LDLCALC", "TRIG", "CHOLHDL", "LDLDIRECT" in the last 72 hours. Anemia Panel: Recent Labs    07/06/23 1131  VITAMINB12 236  FOLATE 15.3  FERRITIN 6*  TIBC 444  IRON 15*  RETICCTPCT 0.5   Urine analysis:    Component Value Date/Time   COLORURINE YELLOW 11/27/2013 1239   APPEARANCEUR CLEAR 11/27/2013 1239   LABSPEC 1.018 11/27/2013 1239   PHURINE 5.5 11/27/2013 1239   GLUCOSEU NEG 11/27/2013 1239   HGBUR NEG 11/27/2013 1239   BILIRUBINUR NEG 11/27/2013 1239   KETONESUR NEG 11/27/2013 1239   PROTEINUR NEG 11/27/2013 1239   PROTEINUR neg 11/27/2013 1216   UROBILINOGEN 0.2 11/27/2013 1239   NITRITE NEG 11/27/2013 1239   NITRITE neg 11/27/2013 1216   LEUKOCYTESUR MOD (A) 11/27/2013 1239   Sepsis Labs: Invalid input(s): "PROCALCITONIN", "LACTICIDVEN"   SIGNED:  Theadore Finger, MD  [facility] 07/08/2023, 1:43 PM

## 2023-07-08 NOTE — Evaluation (Signed)
 Physical Therapy Evaluation Patient Details Name: [name] MRN: [id number] DOB: 07-04-1931 Today's Date: 07/08/2023  History of Present Illness  89 y.o. female who lives  with her son and with medical history of CAD, DM, hypertension who presented to the ED with complaint of abnormal labs  Patient reports that she had routine labs drawn at PCPs office, was advised to come to the ER urgently for evaluation.  She is unsure what lab test was abnormal.  She is asymptomatic, she reports that she feels normal.  She is compliant with home medications.  No fevers, chills, abdominal pain, chest pain, nausea or vomiting, change in bowel or bladder function, no melena or BRBPR, no rashes, no significant weight changes  Patient otherwise denies any nausea, vomiting, chest pain, shortness of breath, fever, chills, headache, focal weakness, numbness tingling, speech difficulties   Clinical Impression  Patient demonstrates modified independence with bed mobility, functional transfers and ambulation. Pt demonstrates baseline level of function with ability to perform bed to commode transfer with modified independence. Pt ambulates with SPC at decreased speed but pt states she is at her normal pace, CGA and gait belt used during todays session. Patient also demonstrates flexed trunk during ambulation as well as with sitting balance. Patient discharged from physical therapy to care of nursing for ambulation daily as tolerated for length of stay.          If plan is discharge home, recommend the following: A little help with walking and/or transfers;A little help with bathing/dressing/bathroom;Assistance with cooking/housework;Assist for transportation;Help with stairs or ramp for entrance   Can travel by private vehicle        Equipment Recommendations None recommended by PT  Recommendations for Other Services       Functional Status Assessment Patient has had a recent decline in their functional status  and demonstrates the ability to make significant improvements in function in a reasonable and predictable amount of time.     Precautions / Restrictions Precautions Precautions: Fall Recall of Precautions/Restrictions: Intact Restrictions Weight Bearing Restrictions Per Provider Order: No      Mobility  Bed Mobility Overal bed mobility: Modified Independent             General bed mobility comments: pt uses elevated HOB and hand rails for supine to sitting EOB    Transfers Overall transfer level: Modified independent Equipment used: Straight cane               General transfer comment: Pt requires increased time for STS but is able to complete without therapist assistance.    Ambulation/Gait Ambulation/Gait assistance: Modified independent (Device/Increase time) Gait Distance (Feet): 70 Feet Assistive device: Straight cane Gait Pattern/deviations: Decreased step length - right, Decreased step length - left, Decreased stride length, Step-through pattern, Trunk flexed Gait velocity: decreased        Stairs            Wheelchair Mobility     Tilt Bed    Modified Rankin (Stroke Patients Only)       Balance Overall balance assessment: Modified Independent                                           Pertinent Vitals/Pain Pain Assessment Pain Assessment: No/denies pain    Home Living Family/patient expects to be discharged to:: Private residence Living Arrangements: Alone Available Help at Discharge:  Family Type of Home: House Home Access: Stairs to enter Entrance Stairs-Rails: Doctor, general practice of Steps: 4   Home Layout: One level Home Equipment: Cane - single point;Shower seat;Grab bars - toilet;Grab bars - tub/shower      Prior Function Prior Level of Function : Independent/Modified Independent             Mobility Comments: modified independent with cane ADLs Comments: Pt has both sons that help  with heavier house chores.     Extremity/Trunk Assessment   Upper Extremity Assessment Upper Extremity Assessment: Overall WFL for tasks assessed    Lower Extremity Assessment Lower Extremity Assessment: Overall WFL for tasks assessed    Cervical / Trunk Assessment Cervical / Trunk Assessment: Kyphotic  Communication   Communication Communication: No apparent difficulties    Cognition Arousal: Alert Behavior During Therapy: WFL for tasks assessed/performed   PT - Cognitive impairments: No apparent impairments                         Following commands: Intact       Cueing Cueing Techniques: Verbal cues     General Comments      Exercises     Assessment/Plan    PT Assessment Patient does not need any further PT services  PT Problem List         PT Treatment Interventions      PT Goals (Current goals can be found in the Care Plan section)  Acute Rehab PT Goals Patient Stated Goal: to return home for mothers day PT Goal Formulation: With patient Time For Goal Achievement: 07/08/23 Potential to Achieve Goals: Good    Frequency       Co-evaluation               AM-PAC PT "6 Clicks" Mobility  Outcome Measure Help needed turning from your back to your side while in a flat bed without using bedrails?: A Little Help needed moving from lying on your back to sitting on the side of a flat bed without using bedrails?: A Little Help needed moving to and from a bed to a chair (including a wheelchair)?: None Help needed standing up from a chair using your arms (e.g., wheelchair or bedside chair)?: None Help needed to walk in hospital room?: None Help needed climbing 3-5 steps with a railing? : A Little 6 Click Score: 21    End of Session Equipment Utilized During Treatment: Gait belt Activity Tolerance: Patient tolerated treatment well Patient left: in chair;with call bell/phone within reach Nurse Communication: Mobility status;Precautions PT  Visit Diagnosis: Other abnormalities of gait and mobility (R26.89);History of falling (Z91.81)    Time: 1610-9604 PT Time Calculation (min) (ACUTE ONLY): 25 min   Charges:   PT Evaluation $PT Eval Low Complexity: 1 Low PT Treatments $Therapeutic Activity: 8-22 mins PT General Charges $$ ACUTE PT VISIT: 1 Visit         Armond Bertin, PT, DPT [facility] Office: [phone] 8:29 AM, 07/08/23

## 2023-08-01 DIAGNOSIS — R600 Localized edema: Secondary | ICD-10-CM | POA: Diagnosis not present

## 2023-08-01 DIAGNOSIS — I10 Essential (primary) hypertension: Secondary | ICD-10-CM | POA: Diagnosis not present

## 2023-08-01 DIAGNOSIS — R011 Cardiac murmur, unspecified: Secondary | ICD-10-CM | POA: Diagnosis not present

## 2023-08-01 DIAGNOSIS — E118 Type 2 diabetes mellitus with unspecified complications: Secondary | ICD-10-CM | POA: Diagnosis not present

## 2023-08-31 DIAGNOSIS — E118 Type 2 diabetes mellitus with unspecified complications: Secondary | ICD-10-CM | POA: Diagnosis not present

## 2023-08-31 DIAGNOSIS — I10 Essential (primary) hypertension: Secondary | ICD-10-CM | POA: Diagnosis not present

## 2023-10-22 DIAGNOSIS — Z1389 Encounter for screening for other disorder: Secondary | ICD-10-CM | POA: Diagnosis not present

## 2023-10-22 DIAGNOSIS — I10 Essential (primary) hypertension: Secondary | ICD-10-CM | POA: Diagnosis not present

## 2023-10-22 DIAGNOSIS — M1990 Unspecified osteoarthritis, unspecified site: Secondary | ICD-10-CM | POA: Diagnosis not present

## 2023-10-22 DIAGNOSIS — E118 Type 2 diabetes mellitus with unspecified complications: Secondary | ICD-10-CM | POA: Diagnosis not present

## 2023-10-22 DIAGNOSIS — D509 Iron deficiency anemia, unspecified: Secondary | ICD-10-CM | POA: Diagnosis not present

## 2023-10-22 DIAGNOSIS — R799 Abnormal finding of blood chemistry, unspecified: Secondary | ICD-10-CM | POA: Diagnosis not present

## 2023-10-22 DIAGNOSIS — E1142 Type 2 diabetes mellitus with diabetic polyneuropathy: Secondary | ICD-10-CM | POA: Diagnosis not present

## 2023-10-22 DIAGNOSIS — Z0001 Encounter for general adult medical examination with abnormal findings: Secondary | ICD-10-CM | POA: Diagnosis not present

## 2023-10-22 DIAGNOSIS — E785 Hyperlipidemia, unspecified: Secondary | ICD-10-CM | POA: Diagnosis not present

## 2023-10-22 DIAGNOSIS — I2510 Atherosclerotic heart disease of native coronary artery without angina pectoris: Secondary | ICD-10-CM | POA: Diagnosis not present

## 2023-10-23 IMAGING — US US EXTREM LOW VENOUS
1 series · 13 of 24 positions shown · non-contrast
Comparison: None Available.

CLINICAL DATA: Lower extremity pain and edema.



[Series 1: us venous img lower bilat (dvt) · portal-venous · 13 of 70 slices shown]
[im 1/70]
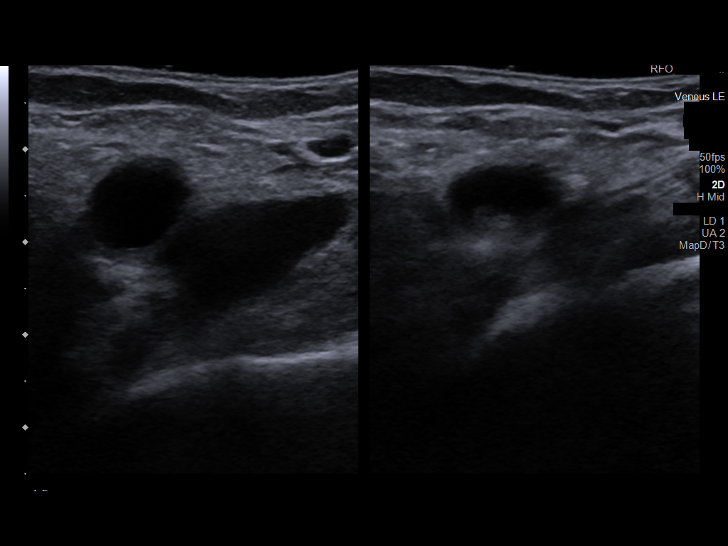
[im 7/70]
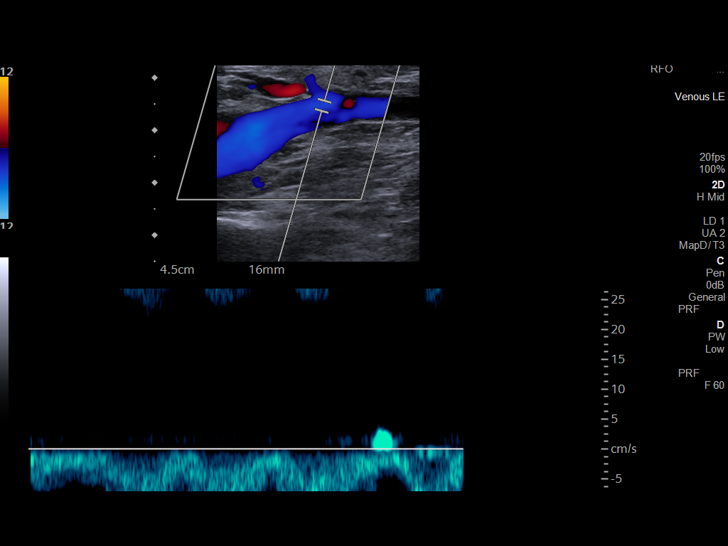
[im 13/70]
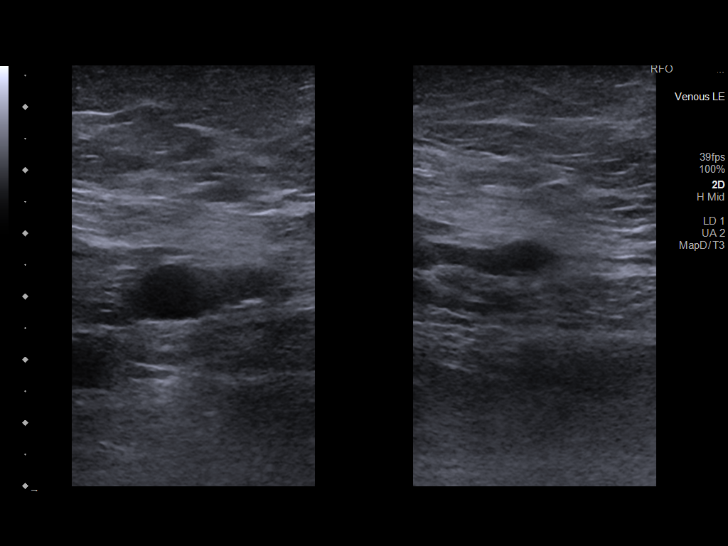
[im 19/70]
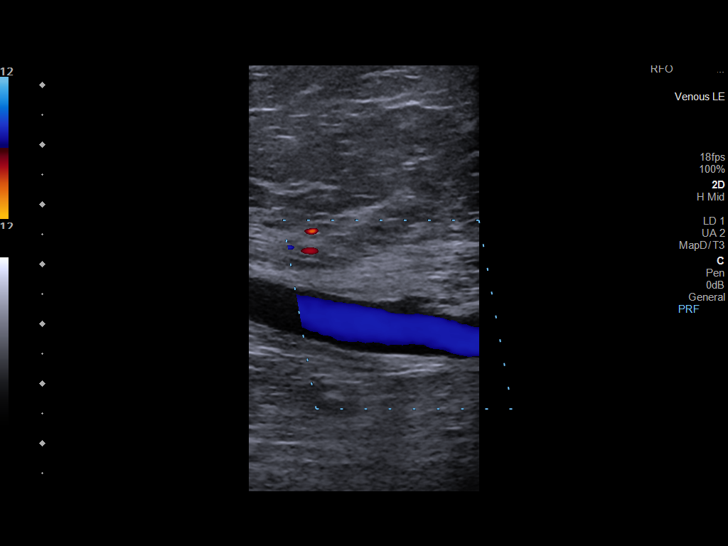
[im 25/70]
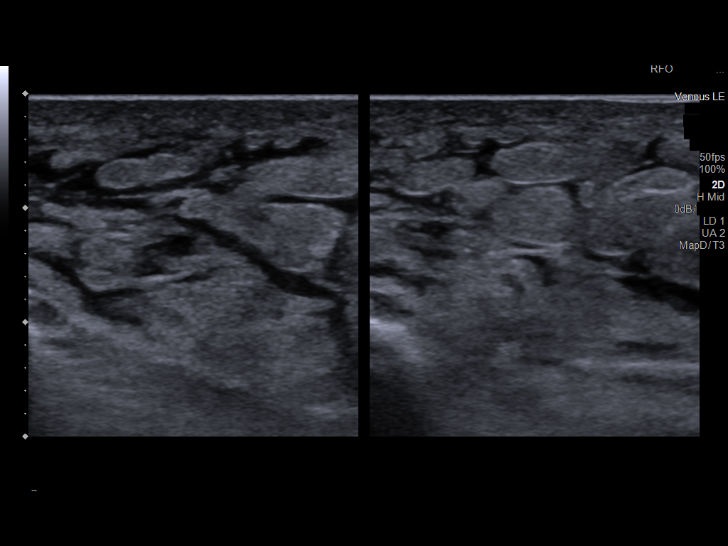
[im 31/70]
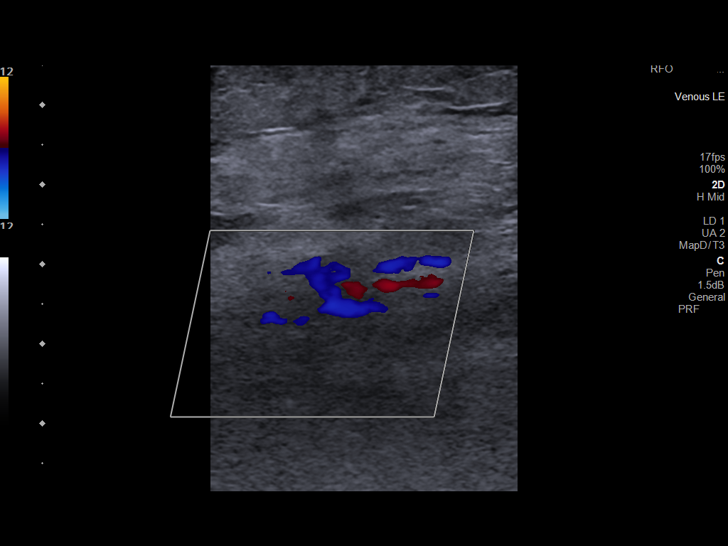
[im 37/70]
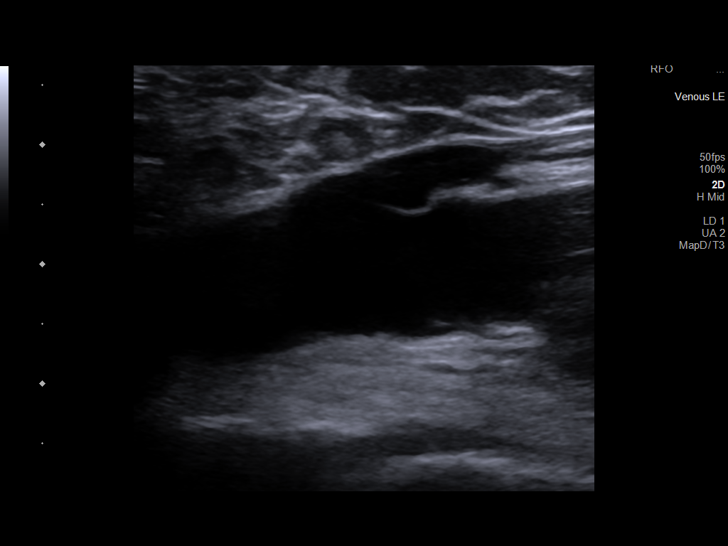
[im 40/70]
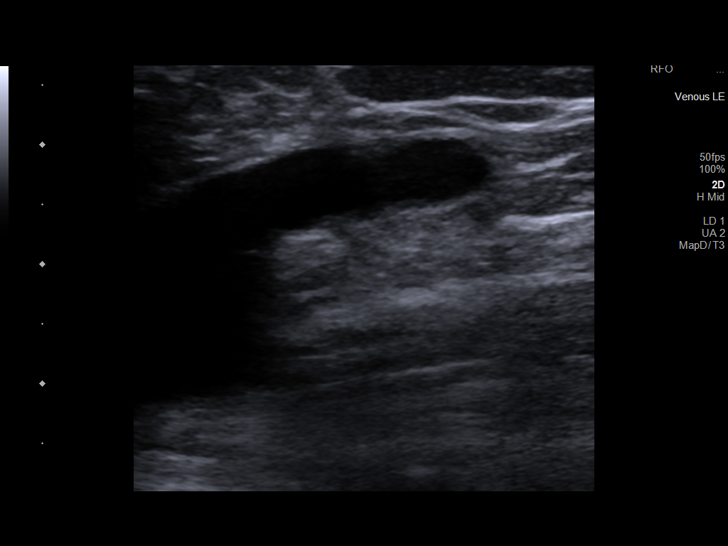
[im 46/70]
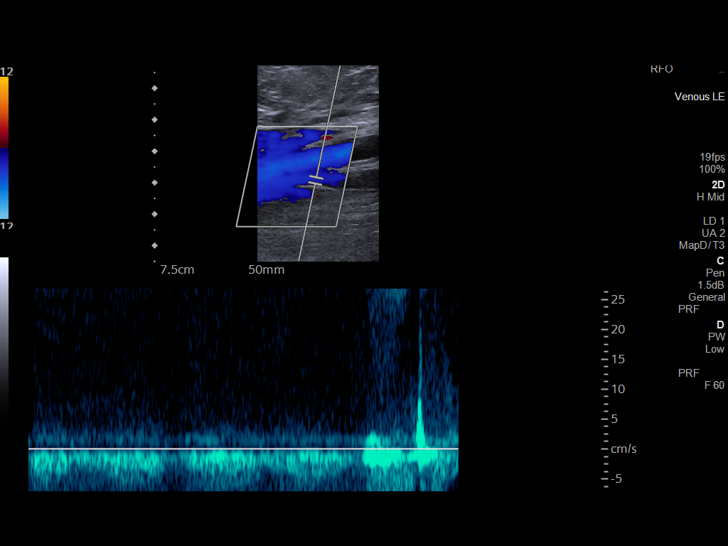
[im 52/70]
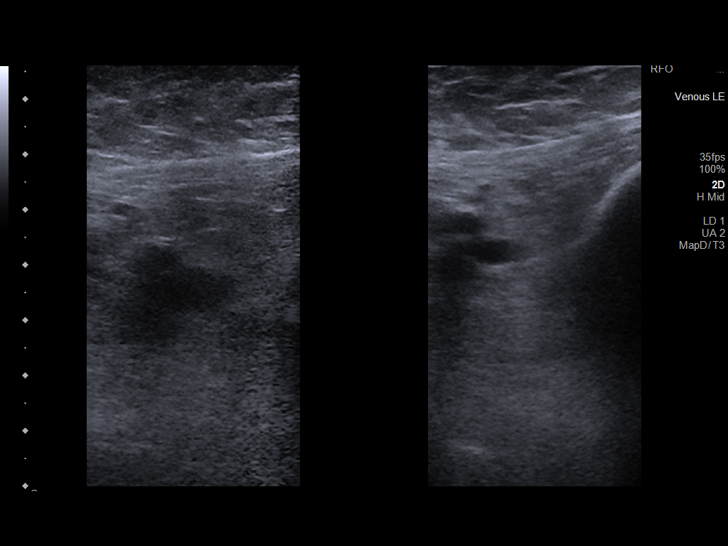
[im 58/70]
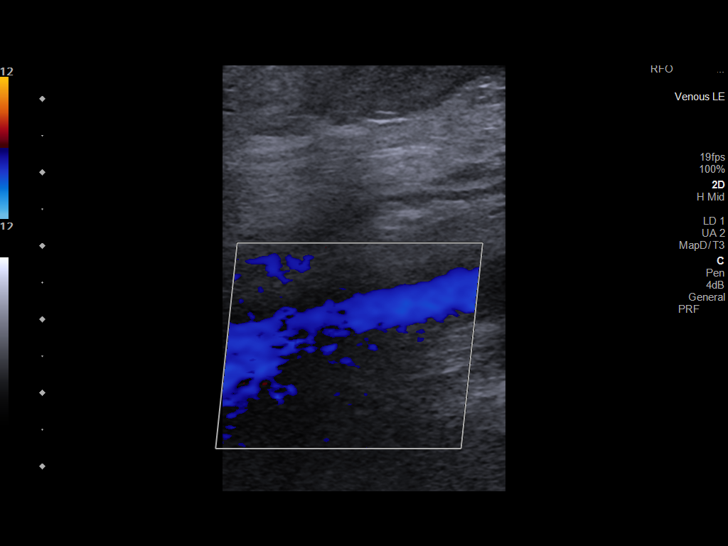
[im 64/70]
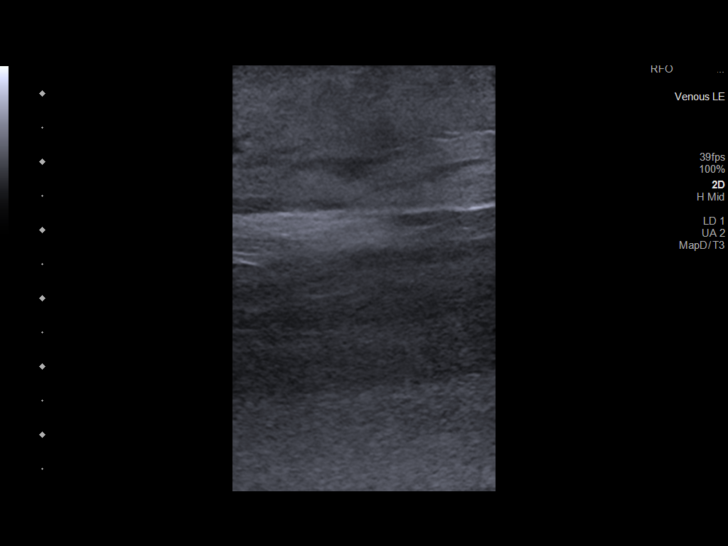
[im 70/70]
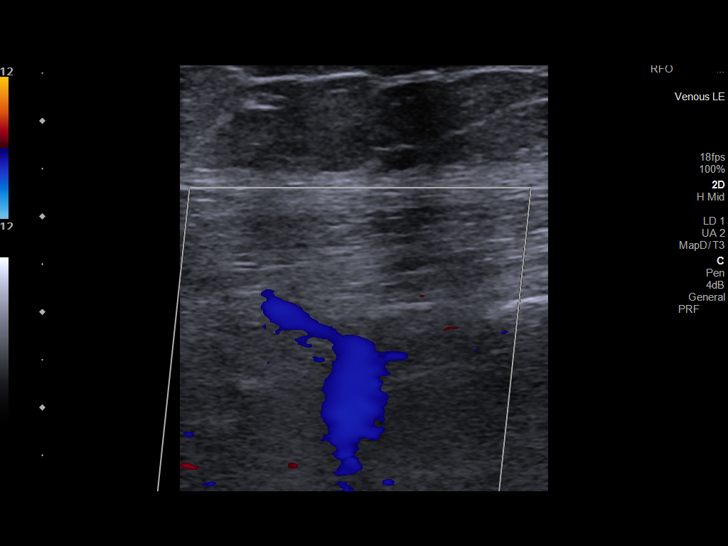

[13 of 24 positions shown; findings below may reference images not displayed]

FINDINGS: RIGHT LOWER EXTREMITY

Common Femoral Vein: No evidence of thrombus. Normal
compressibility, respiratory phasicity and response to augmentation.

Saphenofemoral Junction: No evidence of thrombus. Normal
compressibility and flow on color Doppler imaging.

Profunda Femoral Vein: No evidence of thrombus. Normal
compressibility and flow on color Doppler imaging.

Femoral Vein: No evidence of thrombus. Normal compressibility,
respiratory phasicity and response to augmentation.

Popliteal Vein: No evidence of thrombus. Normal compressibility,
respiratory phasicity and response to augmentation.

Calf Veins: No evidence of thrombus. Normal compressibility and flow
on color Doppler imaging.

Superficial Great Saphenous Vein: No evidence of thrombus. Normal
compressibility.

Venous Reflux:  None.

Other Findings: No evidence of superficial thrombophlebitis or
abnormal fluid collection.

LEFT LOWER EXTREMITY

Common Femoral Vein: No evidence of thrombus. Normal
compressibility, respiratory phasicity and response to augmentation.

Saphenofemoral Junction: No evidence of thrombus. Normal
compressibility and flow on color Doppler imaging.

Profunda Femoral Vein: No evidence of thrombus. Normal
compressibility and flow on color Doppler imaging.

Femoral Vein: No evidence of thrombus. Normal compressibility,
respiratory phasicity and response to augmentation.

Popliteal Vein: No evidence of thrombus. Normal compressibility,
respiratory phasicity and response to augmentation.

Calf Veins: No evidence of thrombus. Normal compressibility and flow
on color Doppler imaging.

Superficial Great Saphenous Vein: No evidence of thrombus. Normal
compressibility.

Venous Reflux:  None.

Other Findings: No evidence of superficial thrombophlebitis or
abnormal fluid collection.
IMPRESSION: No evidence of deep venous thrombosis in either lower extremity.

## 2023-11-22 DIAGNOSIS — I10 Essential (primary) hypertension: Secondary | ICD-10-CM | POA: Diagnosis not present

## 2023-11-22 DIAGNOSIS — E118 Type 2 diabetes mellitus with unspecified complications: Secondary | ICD-10-CM | POA: Diagnosis not present

## 2023-12-22 DIAGNOSIS — I10 Essential (primary) hypertension: Secondary | ICD-10-CM | POA: Diagnosis not present

## 2023-12-22 DIAGNOSIS — E118 Type 2 diabetes mellitus with unspecified complications: Secondary | ICD-10-CM | POA: Diagnosis not present

## 2024-03-03 ENCOUNTER — Encounter (HOSPITAL_COMMUNITY): Payer: Self-pay

## 2024-03-03 ENCOUNTER — Emergency Department (HOSPITAL_COMMUNITY)

## 2024-03-03 ENCOUNTER — Other Ambulatory Visit: Payer: Self-pay

## 2024-03-03 ENCOUNTER — Observation Stay (HOSPITAL_COMMUNITY)
Admission: EM | Admit: 2024-03-03 | Discharge: 2024-03-05 | Disposition: A | Discharge status: Home Health | Attending: Internal Medicine | Admitting: Internal Medicine

## 2024-03-03 DIAGNOSIS — E119 Type 2 diabetes mellitus without complications: Secondary | ICD-10-CM | POA: Insufficient documentation

## 2024-03-03 DIAGNOSIS — J449 Chronic obstructive pulmonary disease, unspecified: Secondary | ICD-10-CM | POA: Insufficient documentation

## 2024-03-03 DIAGNOSIS — I110 Hypertensive heart disease with heart failure: Secondary | ICD-10-CM | POA: Diagnosis not present

## 2024-03-03 DIAGNOSIS — Z79899 Other long term (current) drug therapy: Secondary | ICD-10-CM | POA: Insufficient documentation

## 2024-03-03 DIAGNOSIS — I509 Heart failure, unspecified: Secondary | ICD-10-CM | POA: Diagnosis not present

## 2024-03-03 DIAGNOSIS — R600 Localized edema: Secondary | ICD-10-CM | POA: Insufficient documentation

## 2024-03-03 DIAGNOSIS — R531 Weakness: Secondary | ICD-10-CM | POA: Insufficient documentation

## 2024-03-03 DIAGNOSIS — I2510 Atherosclerotic heart disease of native coronary artery without angina pectoris: Secondary | ICD-10-CM | POA: Insufficient documentation

## 2024-03-03 DIAGNOSIS — R0602 Shortness of breath: Secondary | ICD-10-CM | POA: Diagnosis present

## 2024-03-03 DIAGNOSIS — D509 Iron deficiency anemia, unspecified: Secondary | ICD-10-CM | POA: Insufficient documentation

## 2024-03-03 DIAGNOSIS — Z7984 Long term (current) use of oral hypoglycemic drugs: Secondary | ICD-10-CM | POA: Insufficient documentation

## 2024-03-03 DIAGNOSIS — D649 Anemia, unspecified: Secondary | ICD-10-CM

## 2024-03-03 HISTORY — DX: Chronic obstructive pulmonary disease, unspecified: J44.9

## 2024-03-03 LAB — CBC WITH DIFFERENTIAL/PLATELET
Abs Immature Granulocytes: 0.02 K/uL (ref 0.00–0.07)
Basophils Absolute: 0.1 K/uL (ref 0.0–0.1)
Basophils Relative: 1 %
Eosinophils Absolute: 0.2 K/uL (ref 0.0–0.5)
Eosinophils Relative: 3 %
HCT: 32.8 % — ABNORMAL LOW (ref 36.0–46.0)
Hemoglobin: 10.3 g/dL — ABNORMAL LOW (ref 12.0–15.0)
Immature Granulocytes: 0 %
Lymphocytes Relative: 25 %
Lymphs Abs: 1.6 K/uL (ref 0.7–4.0)
MCH: 26.8 pg (ref 26.0–34.0)
MCHC: 31.4 g/dL (ref 30.0–36.0)
MCV: 85.2 fL (ref 80.0–100.0)
Monocytes Absolute: 0.7 K/uL (ref 0.1–1.0)
Monocytes Relative: 10 %
Neutro Abs: 4.1 K/uL (ref 1.7–7.7)
Neutrophils Relative %: 61 %
Platelets: 243 K/uL (ref 150–400)
RBC: 3.85 MIL/uL — ABNORMAL LOW (ref 3.87–5.11)
RDW: 14.6 % (ref 11.5–15.5)
WBC: 6.6 K/uL (ref 4.0–10.5)
nRBC: 0 % (ref 0.0–0.2)

## 2024-03-03 LAB — COMPREHENSIVE METABOLIC PANEL WITH GFR
ALT: <5 U/L (ref 0–44)
AST: 14 U/L — ABNORMAL LOW (ref 15–41)
Albumin: 4.1 g/dL (ref 3.5–5.0)
Alkaline Phosphatase: 100 U/L (ref 38–126)
Anion gap: 5 (ref 5–15)
BUN: 26 mg/dL — ABNORMAL HIGH (ref 8–23)
CO2: 34 mmol/L — ABNORMAL HIGH (ref 22–32)
Calcium: 9.6 mg/dL (ref 8.9–10.3)
Chloride: 101 mmol/L (ref 98–111)
Creatinine, Ser: 1.05 mg/dL — ABNORMAL HIGH (ref 0.44–1.00)
GFR, Estimated: 50 mL/min — ABNORMAL LOW
Glucose, Bld: 132 mg/dL — ABNORMAL HIGH (ref 70–99)
Potassium: 4.8 mmol/L (ref 3.5–5.1)
Sodium: 140 mmol/L (ref 135–145)
Total Bilirubin: 0.7 mg/dL (ref 0.0–1.2)
Total Protein: 6.6 g/dL (ref 6.5–8.1)

## 2024-03-03 LAB — TROPONIN T, HIGH SENSITIVITY: Troponin T High Sensitivity: 46 ng/L — ABNORMAL HIGH (ref 0–19)

## 2024-03-03 LAB — RESP PANEL BY RT-PCR (RSV, FLU A&B, COVID)  RVPGX2
Influenza A by PCR: NEGATIVE
Influenza B by PCR: NEGATIVE
Resp Syncytial Virus by PCR: NEGATIVE
SARS Coronavirus 2 by RT PCR: NEGATIVE

## 2024-03-03 LAB — PRO BRAIN NATRIURETIC PEPTIDE: Pro Brain Natriuretic Peptide: 2375 pg/mL — ABNORMAL HIGH

## 2024-03-03 NOTE — ED Provider Notes (Signed)
 " [facility] Provider Note   CSN: [id number] Arrival date & time: 03/03/24  2152     Patient presents with: Shortness of Breath   [name] is a 89 y.o. female.    Shortness of Breath Patient presents shortness of breath.  Has had cough.  States heart is often pounding fluttering and going fast.  Legs have also been swollen.  More shortness of breath.  More fatigue with walking.  No definite sick contacts.    Past Medical History:  Diagnosis Date   Acid reflux    Arthritis    Burning with urination 11/27/2013   COPD (chronic obstructive pulmonary disease) (HCC)    Coronary artery disease    Diabetes mellitus    Glaucoma (increased eye pressure)    Hematuria 11/27/2013   Hypertension    Superficial fungus infection of skin 11/27/2013    Prior to Admission medications  Medication Sig Start Date End Date Taking? Authorizing Provider  ferrous sulfate  325 (65 FE) MG tablet Take 1 tablet (325 mg total) by mouth daily with breakfast. 07/08/23   Gonfa, Taye T, MD  gabapentin  (NEURONTIN ) 600 MG tablet Take 600 mg by mouth 3 (three) times daily.      [provider]  metFORMIN (GLUCOPHAGE-XR) 500 MG 24 hr tablet Take 500 mg by mouth 2 (two) times daily. 06/29/21   [provider]  Multiple Vitamin (MULTIVITAMIN) tablet Take 1 tablet by mouth daily.    [provider]  pantoprazole  (PROTONIX ) 40 MG tablet Take 1 tablet (40 mg total) by mouth daily. 07/08/23   Gonfa, Taye T, MD  senna-docusate (SENOKOT-S) 8.6-50 MG tablet Take 1 tablet by mouth 2 (two) times daily between meals as needed for mild constipation. 07/08/23   Gonfa, Taye T, MD  simvastatin  (ZOCOR ) 20 MG tablet Take 20 mg by mouth daily.     [provider]  torsemide (DEMADEX) 20 MG tablet Take 20 mg by mouth 2 (two) times daily. 11/03/20   [provider]  vitamin B-12 (VITAMIN B12) 1000 MCG tablet Take 1 tablet (1,000 mcg total) by  mouth daily. 07/08/23   Gonfa, Taye T, MD    Allergies: Patient has no known allergies.    Review of Systems  Respiratory:  Positive for shortness of breath.     Updated Vital Signs BP (!) 115/55   Pulse 86   Temp 98.1 F (36.7 C)   Resp 15   Ht 5' 3 (1.6 m)   Wt 72.6 kg   SpO2 100%   BMI 28.35 kg/m   Physical Exam Vitals and nursing note reviewed.  HENT:     Head: Normocephalic.  Cardiovascular:     Rate and Rhythm: Regular rhythm.  Pulmonary:     Comments: Harsh breath sounds, particularly at the bases. Musculoskeletal:     Right lower leg: Edema present.     Left lower leg: Edema present.     Comments: Pitting edema bilateral lower extremities.  Skin:    Capillary Refill: Capillary refill takes less than 2 seconds.  Neurological:     Mental Status: She is alert and oriented to person, place, and time.     (all labs ordered are listed, but only abnormal results are displayed) Labs Reviewed  RESP PANEL BY RT-PCR (RSV, FLU A&B, COVID)  [id number]  COMPREHENSIVE METABOLIC PANEL WITH GFR  PRO BRAIN NATRIURETIC PEPTIDE  CBC WITH DIFFERENTIAL/PLATELET  TROPONIN T, HIGH SENSITIVITY  EKG: None  Radiology: DG Chest Portable 1 View Result Date: 03/03/2024 EXAM: 1 VIEW(S) XRAY OF THE CHEST 03/03/2024 10:31:00 PM COMPARISON: 07/05/2023 CLINICAL HISTORY: sob FINDINGS: LUNGS AND PLEURA: No focal pulmonary opacity. No pleural effusion. No pneumothorax. HEART AND MEDIASTINUM: No acute abnormality of the cardiac and mediastinal silhouettes. BONES AND SOFT TISSUES: No acute osseous abnormality. IMPRESSION: 1. No acute process. Electronically signed by: Franky Crease MD 03/03/2024 10:38 PM EST RP Workstation: [id number]     Procedures   Medications Ordered in the ED - No data to display                                  Medical Decision Making Amount and/or Complexity of Data Reviewed Labs: ordered. Radiology: ordered.   Patient has shortness of breath and cough.   Differential diagnoses longed but include causes such as pneumonia, COVID, flu, CHF.  Will get x-ray.  Will get blood work.  Had some mild hypotension but will continue to monitor.  Chest x-ray reassuring.  Blood work pending.  Care turned over to Dr. Raford.     Final diagnoses:  None    ED Discharge Orders     None          Patsey Lot, MD 03/03/24 2309  "

## 2024-03-03 NOTE — ED Provider Notes (Signed)
 Care assumed from Dr. Patsey, patient with shortness of breath, edema. Labs are pending. Will likely need admission.  I have reviewed her laboratory tests, my interpretation is markedly elevated BNP, mildly elevated troponin likely from demand ischemia, minimal elevation of BUN consistent with heart failure, elevated random glucose level, stable anemia, negative PCR for COVID-19 and influenza and RSV.  Chest x-ray shows no acute cardiopulmonary process.  Have independently viewed the image, and agree with radiologist's interpretation.  I have reviewed her electrocardiogram, and my interpretation is sinus rhythm with early R wave progression across the precordium, PVC, unchanged from prior except for presence of PVC.  I have ordered intravenous furosemide .  She will need admission for further cardiac evaluation.  I have discussed case with Dr. Shona of [facility], who agrees to admit the patient.   EKG Interpretation Date/Time:  Monday March 03 2024 22:23:24 EST Ventricular Rate:  81 PR Interval:  172 QRS Duration:  85 QT Interval:  406 QTC Calculation: 472 R Axis:   35  Text Interpretation: Sinus rhythm Ventricular premature complex Abnormal R-wave progression, early transition When compared with ECG of 09/05/2008, Premature ventricular complexes are now present Confirmed by Raford Lenis ([id number]) on 03/03/2024 11:53:02 PM          Raford Lenis, MD 03/04/24 [phone]

## 2024-03-03 NOTE — ED Triage Notes (Signed)
 Pt to ED POV by family (son) with c/o sob, coughing up mucus, and heart pounding/fluttering that started Sunday and has gotten worse. Pt says lately her legs have been swollen. Pt says she has some cp but mostly sob and heart racing/fluttering feeling.

## 2024-03-04 ENCOUNTER — Other Ambulatory Visit: Payer: Self-pay

## 2024-03-04 ENCOUNTER — Observation Stay (HOSPITAL_COMMUNITY)

## 2024-03-04 ENCOUNTER — Other Ambulatory Visit (HOSPITAL_COMMUNITY): Payer: Self-pay | Admitting: *Deleted

## 2024-03-04 ENCOUNTER — Encounter (HOSPITAL_COMMUNITY): Payer: Self-pay | Admitting: Internal Medicine

## 2024-03-04 DIAGNOSIS — I50811 Acute right heart failure: Secondary | ICD-10-CM

## 2024-03-04 DIAGNOSIS — I5031 Acute diastolic (congestive) heart failure: Secondary | ICD-10-CM | POA: Diagnosis not present

## 2024-03-04 DIAGNOSIS — I509 Heart failure, unspecified: Secondary | ICD-10-CM

## 2024-03-04 LAB — ECHOCARDIOGRAM COMPLETE
AR max vel: 1.97 cm2
AV Area VTI: 1.85 cm2
AV Area mean vel: 1.61 cm2
AV Mean grad: 5 mmHg
AV Peak grad: 11.7 mmHg
Ao pk vel: 1.71 m/s
Area-P 1/2: 4.57 cm2
Calc EF: 60.3 %
Height: 63 in
MV M vel: 5.32 m/s
MV Peak grad: 113.2 mmHg
Radius: 1.5 cm
S' Lateral: 3.2 cm
Single Plane A2C EF: 65.9 %
Single Plane A4C EF: 58.3 %
Weight: 2698.43 [oz_av]

## 2024-03-04 LAB — CBC
HCT: 29.8 % — ABNORMAL LOW (ref 36.0–46.0)
Hemoglobin: 9.4 g/dL — ABNORMAL LOW (ref 12.0–15.0)
MCH: 26.6 pg (ref 26.0–34.0)
MCHC: 31.5 g/dL (ref 30.0–36.0)
MCV: 84.4 fL (ref 80.0–100.0)
Platelets: 226 K/uL (ref 150–400)
RBC: 3.53 MIL/uL — ABNORMAL LOW (ref 3.87–5.11)
RDW: 14.6 % (ref 11.5–15.5)
WBC: 6.8 K/uL (ref 4.0–10.5)
nRBC: 0 % (ref 0.0–0.2)

## 2024-03-04 LAB — TROPONIN T, HIGH SENSITIVITY: Troponin T High Sensitivity: 47 ng/L — ABNORMAL HIGH (ref 0–19)

## 2024-03-04 LAB — PHOSPHORUS: Phosphorus: 3.1 mg/dL (ref 2.5–4.6)

## 2024-03-04 LAB — BASIC METABOLIC PANEL WITH GFR
Anion gap: 7 (ref 5–15)
BUN: 25 mg/dL — ABNORMAL HIGH (ref 8–23)
CO2: 31 mmol/L (ref 22–32)
Calcium: 9.3 mg/dL (ref 8.9–10.3)
Chloride: 102 mmol/L (ref 98–111)
Creatinine, Ser: 0.99 mg/dL (ref 0.44–1.00)
GFR, Estimated: 54 mL/min — ABNORMAL LOW
Glucose, Bld: 118 mg/dL — ABNORMAL HIGH (ref 70–99)
Potassium: 3.9 mmol/L (ref 3.5–5.1)
Sodium: 139 mmol/L (ref 135–145)

## 2024-03-04 LAB — MAGNESIUM: Magnesium: 2 mg/dL (ref 1.7–2.4)

## 2024-03-04 MED ORDER — PANTOPRAZOLE SODIUM 40 MG PO TBEC
40.0000 mg | DELAYED_RELEASE_TABLET | Freq: Every day | ORAL | Status: DC
  Administered 2024-03-04 – 2024-03-05 (×2): 40 mg via ORAL
  Filled 2024-03-04 (×2): qty 1

## 2024-03-04 MED ORDER — GABAPENTIN 300 MG PO CAPS
600.0000 mg | ORAL_CAPSULE | Freq: Three times a day (TID) | ORAL | Status: DC
  Administered 2024-03-04 – 2024-03-05 (×3): 600 mg via ORAL
  Filled 2024-03-04 (×3): qty 2

## 2024-03-04 MED ORDER — VITAMIN B-12 1000 MCG PO TABS
1000.0000 ug | ORAL_TABLET | Freq: Every day | ORAL | Status: DC
  Administered 2024-03-04 – 2024-03-05 (×2): 1000 ug via ORAL
  Filled 2024-03-04 (×2): qty 1

## 2024-03-04 MED ORDER — ACETAMINOPHEN 500 MG PO TABS
500.0000 mg | ORAL_TABLET | Freq: Four times a day (QID) | ORAL | Status: DC | PRN
  Administered 2024-03-04: 500 mg via ORAL
  Filled 2024-03-04: qty 1

## 2024-03-04 MED ORDER — FUROSEMIDE 10 MG/ML IJ SOLN
40.0000 mg | Freq: Once | INTRAMUSCULAR | Status: AC
  Administered 2024-03-04: 40 mg via INTRAVENOUS
  Filled 2024-03-04: qty 4

## 2024-03-04 MED ORDER — FUROSEMIDE 40 MG PO TABS
40.0000 mg | ORAL_TABLET | Freq: Every day | ORAL | Status: DC
  Administered 2024-03-04 – 2024-03-05 (×2): 40 mg via ORAL
  Filled 2024-03-04 (×2): qty 1

## 2024-03-04 MED ORDER — MELATONIN 3 MG PO TABS
6.0000 mg | ORAL_TABLET | Freq: Every evening | ORAL | Status: DC | PRN
  Administered 2024-03-04: 6 mg via ORAL
  Filled 2024-03-04: qty 2

## 2024-03-04 MED ORDER — FUROSEMIDE 20 MG PO TABS: 20.0000 mg | ORAL_TABLET | Freq: Every day | ORAL | Status: DC

## 2024-03-04 MED ORDER — ENOXAPARIN SODIUM 40 MG/0.4ML IJ SOSY
40.0000 mg | PREFILLED_SYRINGE | INTRAMUSCULAR | Status: DC
  Administered 2024-03-04 – 2024-03-05 (×2): 40 mg via SUBCUTANEOUS
  Filled 2024-03-04 (×2): qty 0.4

## 2024-03-04 MED ORDER — POLYETHYLENE GLYCOL 3350 17 G PO PACK: 17.0000 g | PACK | Freq: Every day | ORAL | Status: DC | PRN

## 2024-03-04 MED ORDER — SIMVASTATIN 20 MG PO TABS
20.0000 mg | ORAL_TABLET | Freq: Every day | ORAL | Status: DC
  Administered 2024-03-04 – 2024-03-05 (×2): 20 mg via ORAL
  Filled 2024-03-04 (×2): qty 1

## 2024-03-04 MED ORDER — PROCHLORPERAZINE EDISYLATE 10 MG/2ML IJ SOLN: 5.0000 mg | Freq: Four times a day (QID) | INTRAMUSCULAR | Status: DC | PRN

## 2024-03-04 MED ORDER — FERROUS SULFATE 325 (65 FE) MG PO TABS
325.0000 mg | ORAL_TABLET | Freq: Every day | ORAL | Status: DC
  Administered 2024-03-05: 325 mg via ORAL
  Filled 2024-03-04: qty 1

## 2024-03-04 NOTE — Plan of Care (Signed)
" °  Problem: Acute Rehab OT Goals (only OT should resolve) Goal: Pt. Will Perform Grooming Flowsheets (Taken 03/04/2024 1236) Pt Will Perform Grooming:  with modified independence  standing Goal: Pt. Will Perform Lower Body Dressing Flowsheets (Taken 03/04/2024 1236) Pt Will Perform Lower Body Dressing: with modified independence Goal: Pt. Will Transfer To Toilet Flowsheets (Taken 03/04/2024 1236) Pt Will Transfer to Toilet:  with modified independence  ambulating Goal: Pt/Caregiver Will Perform Home Exercise Program Flowsheets (Taken 03/04/2024 1236) Pt/caregiver will Perform Home Exercise Program:  Increased strength  Increased ROM  Right Upper extremity  Left upper extremity  Independently  Tykee Heideman OT, MOT  "

## 2024-03-04 NOTE — Evaluation (Signed)
 Physical Therapy Evaluation Patient Details Name: [name] MRN: [id number] DOB: Mar 25, 1931 Today's Date: 03/04/2024  History of Present Illness  [name] is a 89 y.o. female with medical history significant for coronary artery disease, chronic bilateral lower extremity edema, type 2 diabetes, hypertension, osteoarthritis, who presents to the ER with complaints of shortness of breath and sudden onset of palpitations that woke her out of her sleep, lasting a few minutes.  Denies chest pain.  She became alarmed and thought she was having a heart attack.  Denies any sick contacts.  No reported subjective fevers.  States she is always cold.  The patient was brought into the ER by family for further evaluation.   Clinical Impression  Patient agreeable to PT/OT co-evaluation. Patients son and daughter in law were present during session. Patient reports at baseline, she ambulates in her home with [facility] and is independent with ADLs. She has a son that lives right beside her that can assist as needed. This date, pt requires min/mod assist for bed mobility, min assist with STS, and CGA/min for ambulation with SPC. Pt demonstrates some general LE weakness, decreased endurance, increased edema in BLE, and slightly impaired balance all of which she would benefit from continued PT services acutely and in recommended venue to address. Patient remains in chair at end of session, chair alarm in place, call button in reach, all needs met  and nursing and family present.       If plan is discharge home, recommend the following: A little help with walking and/or transfers;Assist for transportation   Can travel by private vehicle        Equipment Recommendations None recommended by PT  Recommendations for Other Services       Functional Status Assessment Patient has had a recent decline in their functional status and demonstrates the ability to make significant improvements in function in a reasonable and  predictable amount of time.     Precautions / Restrictions Precautions Precautions: Fall Recall of Precautions/Restrictions: Intact Restrictions Weight Bearing Restrictions Per Provider Order: No      Mobility  Bed Mobility Overal bed mobility: Needs Assistance Bed Mobility: Supine to Sit     Supine to sit: HOB elevated, Min assist     General bed mobility comments: HOB elevated, pt can initiate transfer but requires min/mod assist to scoot to EOB through hand held assist    Transfers Overall transfer level: Needs assistance Equipment used: Straight cane Transfers: Sit to/from Stand, Bed to chair/wheelchair/BSC Sit to Stand: Min assist   Step pivot transfers: Contact guard assist       General transfer comment: SPC used during STS from EOB, require min assist through HHA with other UE due to general LE weakness, pt demo general slow labored movement    Ambulation/Gait Ambulation/Gait assistance: Contact guard assist, Min assist Gait Distance (Feet): 50 Feet Assistive device: Straight cane Gait Pattern/deviations: Step-through pattern, Decreased step length - right, Decreased step length - left, Decreased stride length, Trunk flexed Gait velocity: Dec     General Gait Details: Pt ambulates in room and hall with SPC, mostly CGA but min assist during turns as pt tends to reach for HHAx1, multiple steps take during turns, dec velocity, mildly unsteady but no overt LOB, limited due to fatigue  Stairs            Wheelchair Mobility     Tilt Bed    Modified Rankin (Stroke Patients Only)  Balance Overall balance assessment: Needs assistance Sitting-balance support: No upper extremity supported, Feet supported Sitting balance-Leahy Scale: Good Sitting balance - Comments: seated at EOB   Standing balance support: During functional activity, Single extremity supported, Reliant on assistive device for balance Standing balance-Leahy Scale: Fair Standing  balance comment: fair using SPC                             Pertinent Vitals/Pain Pain Assessment Pain Assessment: 0-10 Pain Score: 7  Pain Location: B feet Pain Descriptors / Indicators: Other (Comment) (Feels cracking in feet) Pain Intervention(s): Limited activity within patient's tolerance, Repositioned    Home Living Family/patient expects to be discharged to:: Private residence Living Arrangements: Alone Available Help at Discharge: Family;Available PRN/intermittently Type of Home: House Home Access: Ramped entrance       Home Layout: One level Home Equipment: Cane - single Information Systems Manager (2 wheels) Additional Comments: pt has two sons that live close by    Prior Function Prior Level of Function : Needs assist       Physical Assist : ADLs (physical)   ADLs (physical): IADLs Mobility Comments: House hold ambulator with SPC. Supervision to min assist with [facility] when ambulating in the community. reports no falls ADLs Comments: Independent ADL's; assist IADL's from family.     Extremity/Trunk Assessment   Upper Extremity Assessment Upper Extremity Assessment: Defer to OT evaluation RUE Deficits / Details: 3-/5 shoulder flexion but full P/ROM. Generally weak otherwise. Pt reports that this shoulder limitation started about a month ago. LUE Deficits / Details: Generally weak    Lower Extremity Assessment Lower Extremity Assessment: Generalized weakness (ankle DF MMT 4/5 bilat, knee ext 4/5 bilat, and hip flexion 4-/5 bilat. Inc pitting edema in BLE)    Cervical / Trunk Assessment Cervical / Trunk Assessment: Kyphotic  Communication   Communication Communication: No apparent difficulties    Cognition Arousal: Alert Behavior During Therapy: WFL for tasks assessed/performed   PT - Cognitive impairments: No apparent impairments                         Following commands: Intact       Cueing Cueing Techniques: Verbal  cues, Tactile cues, Visual cues     General Comments      Exercises     Assessment/Plan    PT Assessment Patient needs continued PT services;All further PT needs can be met in the next venue of care  PT Problem List Decreased strength;Decreased range of motion;Decreased activity tolerance;Decreased balance;Decreased mobility       PT Treatment Interventions DME instruction;Balance training;Gait training;Functional mobility training;Therapeutic activities;Therapeutic exercise;Patient/family education    PT Goals (Current goals can be found in the Care Plan section)  Acute Rehab PT Goals Patient Stated Goal: Return home PT Goal Formulation: With patient Time For Goal Achievement: 03/07/24 Potential to Achieve Goals: Good    Frequency Min 3X/week     Co-evaluation PT/OT/SLP Co-Evaluation/Treatment: Yes Reason for Co-Treatment: To address functional/ADL transfers PT goals addressed during session: Mobility/safety with mobility OT goals addressed during session: ADL's and self-care       AM-PAC PT 6 Clicks Mobility  Outcome Measure Help needed turning from your back to your side while in a flat bed without using bedrails?: A Little Help needed moving from lying on your back to sitting on the side of a flat bed without using bedrails?: A Lot Help needed moving to  and from a bed to a chair (including a wheelchair)?: A Little Help needed standing up from a chair using your arms (e.g., wheelchair or bedside chair)?: A Little Help needed to walk in hospital room?: A Little Help needed climbing 3-5 steps with a railing? : A Lot 6 Click Score: 16    End of Session Equipment Utilized During Treatment: Gait belt Activity Tolerance: Patient tolerated treatment well;Patient limited by fatigue Patient left: in chair;with call bell/phone within reach;with chair alarm set;with family/visitor present;with nursing/sitter in room Nurse Communication: Mobility status PT Visit Diagnosis:  Unsteadiness on feet (R26.81);Other abnormalities of gait and mobility (R26.89);Muscle weakness (generalized) (M62.81);Pain Pain - Right/Left:  (both) Pain - part of body: Ankle and joints of foot    Time: 8941-8882 PT Time Calculation (min) (ACUTE ONLY): 19 min   Charges:   PT Evaluation $PT Eval Low Complexity: 1 Low   PT General Charges $$ ACUTE PT VISIT: 1 Visit        1:40 PM, 03/04/2024 [name], PT, DPT [place] with [facility]

## 2024-03-04 NOTE — Plan of Care (Signed)
" °  Problem: Acute Rehab PT Goals(only PT should resolve) Goal: Pt Will Go Supine/Side To Sit Outcome: Progressing Flowsheets (Taken 03/04/2024 1341) Pt will go Supine/Side to Sit: with contact guard assist Goal: Patient Will Transfer Sit To/From Stand Outcome: Progressing Flowsheets (Taken 03/04/2024 1341) Patient will transfer sit to/from stand: with contact guard assist Goal: Pt Will Transfer Bed To Chair/Chair To Bed Outcome: Progressing Flowsheets (Taken 03/04/2024 1341) Pt will Transfer Bed to Chair/Chair to Bed: with supervision Goal: Pt Will Ambulate Outcome: Progressing Flowsheets (Taken 03/04/2024 1341) Pt will Ambulate:  with supervision  75 feet  with least restrictive assistive device    1:42 PM, 03/04/2024 Lisha Vitale Powell-Butler, PT, DPT [place] with [facility]  "

## 2024-03-04 NOTE — H&P (Signed)
 " History and Physical  DYLAN RUOTOLO FMW:[id number] DOB: 11/13/1931 DOA: 03/03/2024  Referring physician: Dr. Raford, EDP. PCP: Carlette Benita Area, MD  Outpatient Specialists: None. Patient coming from: Home.  Chief Complaint: Shortness of breath  HPI: [name] is a 89 y.o. female with medical history significant for coronary artery disease, chronic bilateral lower extremity edema, type 2 diabetes, hypertension, osteoarthritis, who presents to the ER with complaints of shortness of breath and sudden onset of palpitations that woke her out of her sleep, lasting a few minutes.  Denies chest pain.  She became alarmed and thought she was having a heart attack.  Denies any sick contacts.  No reported subjective fevers.  States she is always cold.  The patient was brought into the ER by family for further evaluation.  In the ER, afebrile, with no leukocytosis.  Chest x-ray was nonacute.  proBNP 2375, high-sensitivity troponin 46.  Due to concern for acute CHF, the patient received 1 dose of IV Lasix  40 mg x 1.  No prior records of echocardiogram.  TRH, hospitalist service, was asked to admit.  ED Course: Temperature 98.1.  BP 115/55, pulse 86, respiratory 15, O2 saturation 100% on room air.  Review of Systems: Review of systems as noted in the HPI. All other systems reviewed and are negative.   Past Medical History:  Diagnosis Date   Acid reflux    Arthritis    Burning with urination 11/27/2013   COPD (chronic obstructive pulmonary disease) (HCC)    Coronary artery disease    Diabetes mellitus    Glaucoma (increased eye pressure)    Hematuria 11/27/2013   Hypertension    Superficial fungus infection of skin 11/27/2013   Past Surgical History:  Procedure Laterality Date   ABDOMINAL HYSTERECTOMY     APPENDECTOMY     CHOLECYSTECTOMY      Social History:  reports that she has never smoked. She has never used smokeless tobacco. She reports that she does not drink alcohol and  does not use drugs.   Allergies[1]  Family History  Problem Relation Age of Onset   Heart disease Father    Kidney disease Father        kidney failure   Diabetes Brother    Other Sister        cardiac arrest   Cancer Sister        lung   Diabetes Sister    Cancer Brother        lung   Alcohol abuse Brother    Alcohol abuse Brother       Prior to Admission medications  Medication Sig Start Date End Date Taking? Authorizing Provider  ferrous sulfate  325 (65 FE) MG tablet Take 1 tablet (325 mg total) by mouth daily with breakfast. 07/08/23   Gonfa, Taye T, MD  gabapentin  (NEURONTIN ) 600 MG tablet Take 600 mg by mouth 3 (three) times daily.      [provider]  metFORMIN (GLUCOPHAGE-XR) 500 MG 24 hr tablet Take 500 mg by mouth 2 (two) times daily. 06/29/21   [provider]  Multiple Vitamin (MULTIVITAMIN) tablet Take 1 tablet by mouth daily.    [provider]  pantoprazole  (PROTONIX ) 40 MG tablet Take 1 tablet (40 mg total) by mouth daily. 07/08/23   Gonfa, Taye T, MD  senna-docusate (SENOKOT-S) 8.6-50 MG tablet Take 1 tablet by mouth 2 (two) times daily between meals as needed for mild constipation. 07/08/23   Kathrin Mignon DASEN, MD  simvastatin  (ZOCOR ) 20 MG tablet Take 20 mg by mouth daily.     [provider]  torsemide (DEMADEX) 20 MG tablet Take 20 mg by mouth 2 (two) times daily. 11/03/20   [provider]  vitamin B-12 (VITAMIN B12) 1000 MCG tablet Take 1 tablet (1,000 mcg total) by mouth daily. 07/08/23   Kathrin Mignon DASEN, MD    Physical Exam: BP (!) 115/55   Pulse 86   Temp 98.1 F (36.7 C)   Resp 15   Ht 5' 3 (1.6 m)   Wt 72.6 kg   SpO2 100%   BMI 28.35 kg/m   General: 89 y.o. year-old female well developed well nourished in no acute distress.  Alert and oriented x3. Cardiovascular: Regular rate and rhythm with no rubs or gallops.  No thyromegaly or JVD noted.  Bilateral lower extremity edema. Respiratory: Clear to  auscultation with no wheezes or rales. Good inspiratory effort. Abdomen: Soft nontender nondistended with normal bowel sounds x4 quadrants. Muskuloskeletal: No cyanosis or clubbing noted bilaterally Neuro: CN II-XII intact, strength, sensation, reflexes Skin: No ulcerative lesions noted or rashes Psychiatry: Judgement and insight appear normal. Mood is appropriate for condition and setting          Labs on Admission:  Basic Metabolic Panel: Recent Labs  Lab 03/03/24 2238  NA 140  K 4.8  CL 101  CO2 34*  GLUCOSE 132*  BUN 26*  CREATININE 1.05*  CALCIUM 9.6   Liver Function Tests: Recent Labs  Lab 03/03/24 2238  AST 14*  ALT <5  ALKPHOS 100  BILITOT 0.7  PROT 6.6  ALBUMIN 4.1   No results for input(s): LIPASE, AMYLASE in the last 168 hours. No results for input(s): AMMONIA in the last 168 hours. CBC: Recent Labs  Lab 03/03/24 2238  WBC 6.6  NEUTROABS 4.1  HGB 10.3*  HCT 32.8*  MCV 85.2  PLT 243   Cardiac Enzymes: No results for input(s): CKTOTAL, CKMB, CKMBINDEX, TROPONINI in the last 168 hours.  BNP (last 3 results) Recent Labs    07/06/23 1131  BNP 429.0*    ProBNP (last 3 results) Recent Labs    03/03/24 2238  PROBNP 2,375.0*    CBG: No results for input(s): GLUCAP in the last 168 hours.  Radiological Exams on Admission: DG Chest Portable 1 View Result Date: 03/03/2024 EXAM: 1 VIEW(S) XRAY OF THE CHEST 03/03/2024 10:31:00 PM COMPARISON: 07/05/2023 CLINICAL HISTORY: sob FINDINGS: LUNGS AND PLEURA: No focal pulmonary opacity. No pleural effusion. No pneumothorax. HEART AND MEDIASTINUM: No acute abnormality of the cardiac and mediastinal silhouettes. BONES AND SOFT TISSUES: No acute osseous abnormality. IMPRESSION: 1. No acute process. Electronically signed by: Franky Crease MD 03/03/2024 10:38 PM EST RP Workstation: [id number]    EKG: I independently viewed the EKG done and my findings are as followed: Sinus rhythm rate of 81.  QTc  472.  Assessment/Plan Present on Admission: **None**  Principal Problem:   Acute CHF (congestive heart failure) (HCC)  Acute CHF, unspecified Transthoracic echocardiogram to compare Presented with proBNP 2375, volume overload on exam IV diuresing Lasix  40 mg x 1 Given in the ER. Closely Monitor Strict I's and O's and Daily Weight Follow Transthoracic Echo  Coronary artery disease Resume home regimen No reported anginal symptoms. Monitor on telemetry.  Chronic bilateral lower extremity edema Elevate lower extremities. Continue aggressive diuresis.  Type 2 diabetes, stable. Follow hemoglobin A1c Heart healthy, carb modified diet  Generalized weakness PT OT evaluation Fall precautions  Time: 75 minutes   DVT prophylaxis: Subcu Lovenox  daily.  Code Status: Full code.  Family Communication: None at bedside.  Updated patient's son via phone on 03/04/2024 morning.  Disposition Plan: Admitted to telemetry unit.  Consults called: None.  Admission status: Observation status.   Status is: Observation    Terry LOISE Hurst MD [facility] Pager [phone]  If 7PM-7AM, please contact night-coverage [email or web address] Password [email or web address]  03/04/2024, 12:41 AM      [1] No Known Allergies  "

## 2024-03-04 NOTE — TOC Initial Note (Signed)
 Transition of Care [facility]) - Initial/Assessment Note    Patient Details  Name: [name] MRN: [id number] Date of Birth: December 16, 1931  Transition of [facility]) CM/SW Contact:    Noreen KATHEE Pinal, LCSWA Phone Number: 03/04/2024, 3:59 PM  Clinical Narrative:                  CSW spoke with patient at bedside regarding PT recommendation for HHPT. Patient agreeable to HHPT. Patient reports that she lives alone,  but that her son lives next door. Patient reports that she has a walker and cane at home. Referral sent via [facility] and [facility] was able to accept patient insurance for HHPT. CSW will continue to follow.   Expected Discharge Plan: Home w Home Health Services Barriers to Discharge: Continued Medical Work up   Patient Goals and CMS Choice Patient states their goals for this hospitalization and ongoing recovery are:: return back home   Choice offered to / list presented to : Patient      Expected Discharge Plan and Services     Post Acute Care Choice: Durable Medical Equipment Living arrangements for the past 2 months: Single Family Home                           HH Arranged: PT HH Agency: [facility] Date [facility] Agency Contacted: 03/04/24 Time HH Agency Contacted: [id number] Representative spoke with at [facility] Agency: [place]  Prior Living Arrangements/Services Living arrangements for the past 2 months: Single Family Home Lives with:: Self Patient language and need for interpreter reviewed:: Yes Do you feel safe going back to the place where you live?: Yes      Need for Family Participation in Patient Care: Yes (Comment) Care giver support system in place?: No (comment)   Criminal Activity/Legal Involvement Pertinent to Current Situation/Hospitalization: No - Comment as needed  Activities of Daily Living   ADL Screening (condition at time of admission) Independently performs ADLs?: Yes (appropriate for developmental age) Is the patient deaf or have difficulty  hearing?: No Does the patient have difficulty seeing, even when wearing glasses/contacts?: No Does the patient have difficulty concentrating, remembering, or making decisions?: No  Permission Sought/Granted   Permission granted to share information with : Yes, Verbal Permission Granted  Share Information with NAME: Stephaie and Cordella     Permission granted to share info w Relationship: Patient and son     Emotional Assessment Appearance:: Appears stated age Attitude/Demeanor/Rapport: Engaged Affect (typically observed): Accepting Orientation: : Oriented to Self, Oriented to Place, Oriented to  Time, Oriented to Situation Alcohol / Substance Use: Not Applicable Psych Involvement: No (comment)  Admission diagnosis:  Acute CHF (congestive heart failure) (HCC) [I50.9] Patient Active Problem List   Diagnosis Date Noted   Acute CHF (congestive heart failure) (HCC) 03/04/2024   Heart murmur 07/08/2023   Iron  deficiency anemia 07/06/2023   Hematuria 11/27/2013   Burning with urination 11/27/2013   Superficial fungus infection of skin 11/27/2013   OA (osteoarthritis) of knee 07/24/2012   PCP:  Carlette Benita Area, MD Pharmacy:   [facility] [phone] - [place],  - [address] AT [address] [place] [address] Phone: [phone] Fax: [phone]     Social Drivers of Health (SDOH) Social History: SDOH Screenings   Food Insecurity: No Food Insecurity (03/04/2024)  Housing: Low Risk (03/04/2024)  Transportation Needs: No Transportation Needs (  03/04/2024)  Utilities: Not At Risk (03/04/2024)  Financial Resource Strain: Low Risk (03/12/2023)  Social Connections: Moderately Integrated (03/04/2024)  Tobacco Use: Low Risk (03/04/2024)   SDOH Interventions:     Readmission Risk Interventions     No data to display

## 2024-03-04 NOTE — Progress Notes (Signed)
" °  PROGRESS NOTE    [name]  FMW:[id number] DOB: January 06, 1932 DOA: 03/03/2024 PCP: Carlette Benita Area, MD    Same Day Admission Note:   89 y.o. female with medical history significant for coronary artery disease, chronic bilateral lower extremity edema, type 2 diabetes, hypertension, osteoarthritis, who presents to the ER with complaints of shortness of breath and sudden onset of palpitations.   In the ER, afebrile, with no leukocytosis.  Chest x-ray was nonacute.  proBNP 2375, high-sensitivity troponin 46 .   Admitted for possible acute CHF. F/u TTE  She feels good.  Denies any chest pain, palpitations or shortness of breath this morning.  I spoke to her about getting echocardiogram.  She denies any prior cardiac history.  She lives at home by herself but does have family members who come and help her.  She also told me that she is gena be 89 years old next month.  She does have bilateral lower extremity edema/lymphedema and she thinks that it has been getting worse lately.   Deliliah Room, MD [facility]     7PM-7AM, please contact night-coverage  03/04/2024, 8:55 AM  "

## 2024-03-04 NOTE — Progress Notes (Signed)
 Patient eating lunch at present time.                         Aida Pizza, RCS

## 2024-03-04 NOTE — Evaluation (Signed)
 Occupational Therapy Evaluation Patient Details Name: [name] MRN: [id number] DOB: [birth year] Today's Date: 03/04/2024   History of Present Illness   [name] is a 89 y.o. female with medical history significant for coronary artery disease, chronic bilateral lower extremity edema, type 2 diabetes, hypertension, osteoarthritis, who presents to the ER with complaints of shortness of breath and sudden onset of palpitations that woke her out of her sleep, lasting a few minutes.  Denies chest pain.  She became alarmed and thought she was having a heart attack.  Denies any sick contacts.  No reported subjective fevers.  States she is always cold.  The patient was brought into the ER by family for further evaluation. (per DO)     Clinical Impressions Pt agreeable to OT and PT co-evaluation. Pt is independent for ADL's at baseline with use of SPC for household mobility. Pt required min A for bed mobility today. CGA to min A for functional transfers/ambulation with SPC. Pt demonstrates level of set up to CGA for seated ADL's. Pt's R UE limited in A/ROM and strength at the shoulder. Pt reports this started a month ago. Pt left in the chair with call bell within reach and family present. Pt will benefit from continued OT in the hospital to increase strength, balance, and endurance for safe ADL's.        If plan is discharge home, recommend the following:   A little help with walking and/or transfers;A little help with bathing/dressing/bathroom;Assistance with cooking/housework;Assist for transportation;Help with stairs or ramp for entrance     Functional Status Assessment   Patient has had a recent decline in their functional status and demonstrates the ability to make significant improvements in function in a reasonable and predictable amount of time.     Equipment Recommendations   None recommended by OT              Precautions/Restrictions   Precautions Precautions:  Fall Recall of Precautions/Restrictions: Intact Restrictions Weight Bearing Restrictions Per Provider Order: No     Mobility Bed Mobility Overal bed mobility: Needs Assistance Bed Mobility: Supine to Sit     Supine to sit: HOB elevated, Min assist     General bed mobility comments: Assist to scoot to EOB. HOB elevated.    Transfers Overall transfer level: Needs assistance Equipment used: Straight cane Transfers: Sit to/from Stand, Bed to chair/wheelchair/BSC Sit to Stand: Min assist     Step pivot transfers: Contact guard assist     General transfer comment: EOB to chair with RW; assist to boost from EOB by PT      Balance Overall balance assessment: Needs assistance Sitting-balance support: No upper extremity supported, Feet supported Sitting balance-Leahy Scale: Good Sitting balance - Comments: seated at EOB   Standing balance support: During functional activity, Single extremity supported, Reliant on assistive device for balance Standing balance-Leahy Scale: Fair Standing balance comment: poor to fair using SPC                           ADL either performed or assessed with clinical judgement   ADL Overall ADL's : Needs assistance/impaired     Grooming: Set up;Sitting   Upper Body Bathing: Set up;Sitting;Modified independent   Lower Body Bathing: Contact guard assist;Sitting/lateral leans   Upper Body Dressing : Modified independent;Set up;Sitting   Lower Body Dressing: Contact guard assist;Sitting/lateral leans   Toilet Transfer: Contact guard assist;Minimal assistance;Stand-pivot;Ambulation (cane) Toilet Transfer Details (indicate  cue type and reason): EOB to chair and ambulation in the room/hall with SPC. Toileting- Clothing Manipulation and Hygiene: Contact guard assist;Sitting/lateral lean;Set up       Functional mobility during ADLs: Contact guard assist;Minimal assistance;Cane       Vision Baseline Vision/History: 0 No visual  deficits Ability to See in Adequate Light: 0 Adequate Patient Visual Report: No change from baseline Vision Assessment?: No apparent visual deficits     Perception Perception: Not tested       Praxis Praxis: Not tested       Pertinent Vitals/Pain Pain Assessment Pain Assessment: 0-10 Pain Score: 7  Pain Location: B feet Pain Descriptors / Indicators: Other (Comment) (Cracking) Pain Intervention(s): Limited activity within patient's tolerance, Monitored during session, Repositioned     Extremity/Trunk Assessment Upper Extremity Assessment Upper Extremity Assessment: RUE deficits/detail;LUE deficits/detail RUE Deficits / Details: 3-/5 shoulder flexion but full P/ROM. Generally weak otherwise. Pt reports that this shoulder limitation started about a month ago. LUE Deficits / Details: Generally weak   Lower Extremity Assessment Lower Extremity Assessment: Defer to PT evaluation   Cervical / Trunk Assessment Cervical / Trunk Assessment: Kyphotic   Communication Communication Communication: No apparent difficulties   Cognition Arousal: Alert Behavior During Therapy: WFL for tasks assessed/performed Cognition: No apparent impairments                               Following commands: Intact       Cueing  General Comments   Cueing Techniques: Verbal cues;Tactile cues                 Home Living Family/patient expects to be discharged to:: Private residence Living Arrangements: Alone Available Help at Discharge: Family;Available PRN/intermittently (Son lives near the pt.) Type of Home: House Home Access: Ramped entrance     Home Layout: One level     Bathroom Shower/Tub: Chief Strategy Officer: Standard Bathroom Accessibility: Yes How Accessible: Accessible via walker Home Equipment: Cane - single point;BSC/3in1;Rolling Walker (2 wheels)          Prior Functioning/Environment Prior Level of Function : Needs assist        Physical Assist : ADLs (physical)   ADLs (physical): IADLs Mobility Comments: Mod I with use of SPC in the home. Supervision to min assist with [facility] when ambulating in the community. ADLs Comments: Independent ADL's; assist IADL's from family.    OT Problem List: Decreased strength;Decreased range of motion;Impaired balance (sitting and/or standing)   OT Treatment/Interventions: Self-care/ADL training;Therapeutic exercise;DME and/or AE instruction;Therapeutic activities;Energy conservation;Balance training;Patient/family education      OT Goals(Current goals can be found in the care plan section)   Acute Rehab OT Goals Patient Stated Goal: Return home OT Goal Formulation: With patient Time For Goal Achievement: 03/18/24 Potential to Achieve Goals: Good   OT Frequency:  Min 2X/week    Co-evaluation PT/OT/SLP Co-Evaluation/Treatment: Yes Reason for Co-Treatment: To address functional/ADL transfers   OT goals addressed during session: ADL's and self-care      AM-PAC OT 6 Clicks Daily Activity     Outcome Measure Help from another person eating meals?: None Help from another person taking care of personal grooming?: A Little Help from another person toileting, which includes using toliet, bedpan, or urinal?: A Little Help from another person bathing (including washing, rinsing, drying)?: A Little Help from another person to put on and taking off regular upper body clothing?: A  Little Help from another person to put on and taking off regular lower body clothing?: A Little 6 Click Score: 19   End of Session Equipment Utilized During Treatment: Gait belt;Other (comment) (cane)  Activity Tolerance: Patient tolerated treatment well Patient left: in chair;with call bell/phone within reach;with family/visitor present  OT Visit Diagnosis: Unsteadiness on feet (R26.81);Other abnormalities of gait and mobility (R26.89);Muscle weakness (generalized) (M62.81)                Time:  8941-8881 OT Time Calculation (min): 20 min Charges:  OT General Charges $OT Visit: 1 Visit OT Evaluation $OT Eval Low Complexity: 1 Low  Kinley Ferrentino OT, MOT  Jayson Person 03/04/2024, 12:34 PM

## 2024-03-05 DIAGNOSIS — I5031 Acute diastolic (congestive) heart failure: Secondary | ICD-10-CM | POA: Diagnosis not present

## 2024-03-05 MED ORDER — FUROSEMIDE 40 MG PO TABS: 40.0000 mg | ORAL_TABLET | Freq: Every day | ORAL | 0 refills | Status: AC

## 2024-03-05 MED ORDER — POTASSIUM CHLORIDE CRYS ER 10 MEQ PO TBCR: 10.0000 meq | EXTENDED_RELEASE_TABLET | ORAL | 0 refills | Status: AC

## 2024-03-05 NOTE — Care Management Obs Status (Signed)
 MEDICARE OBSERVATION STATUS NOTIFICATION   Patient Details  Name: TENILLE MORRILL MRN: [id number] Date of Birth: 1931/03/15   Medicare Observation Status Notification Given:  Yes    Ronnald MARLA Sil, RN 03/05/2024, 9:43 AM

## 2024-03-05 NOTE — Discharge Instructions (Signed)
 [name]

## 2024-03-05 NOTE — Discharge Summary (Signed)
 " Physician Discharge Summary   Patient: [name] MRN: [id number] DOB: November 14, 1931  Admit date:     03/03/2024  Discharge date: 03/05/2024  Discharge Physician: Deliliah Room   PCP: Carlette Benita Area, MD   Recommendations at discharge:    F/u with your PCP in one week Check your BP and heart rate daily Fluid restriction of 1.5 L per day  Discharge Diagnoses: Principal Problem:   Acute CHF (congestive heart failure) [facility])   Hospital Course:  89 y.o. female with medical history significant for coronary artery disease, chronic bilateral lower extremity edema, type 2 diabetes, hypertension, osteoarthritis, who presents to the ER with complaints of shortness of breath and sudden onset of palpitations.    In the ER, afebrile, with no leukocytosis.  Chest x-ray was nonacute.  proBNP 2375, high-sensitivity troponin 46 .    Admitted for possible acute on chronic diastolic CHF. She denied any prior cardiac history. She lives at home by herself but does have family members who come and help her.  She does have bilateral lower extremity edema/lymphedema and she thinks that it has been getting worse lately.    Transthoracic echocardiogram was done which showed EF 60 to 65%, normal diastolic parameters and no significant valvular abnormalities.  Patient was not requiring any supplemental oxygen.  She denies any chest pain or shortness of breath at the time of the discharge and was able to ambulate.  Home health physical therapy arranged.  She was discharged on Lasix  40 mg p.o. to be taken daily as well as KCl 10 mEq every other day.  She was strongly advised to follow-up with her PCP next week and get her BMP checked.  Patient's friend was present at the bedside and case was discussed with her on the day of the discharge.  Her blood pressure was on the lower side so her home lisinopril was stopped.      Consultants: None Procedures performed: None  Disposition: Home health Diet  recommendation:  Cardiac diet DISCHARGE MEDICATION: Allergies as of 03/05/2024   No Known Allergies      Medication List     STOP taking these medications    lisinopril 20 MG tablet Commonly known as: ZESTRIL   torsemide 20 MG tablet Commonly known as: DEMADEX       TAKE these medications    cyanocobalamin  1000 MCG tablet Commonly known as: VITAMIN B12 Take 1 tablet (1,000 mcg total) by mouth daily.   ferrous sulfate  325 (65 FE) MG tablet Take 1 tablet (325 mg total) by mouth daily with breakfast.   furosemide  40 MG tablet Commonly known as: LASIX  Take 1 tablet (40 mg total) by mouth daily. Start taking on: March 06, 2024   gabapentin  600 MG tablet Commonly known as: NEURONTIN  Take 600 mg by mouth 3 (three) times daily.   metFORMIN 500 MG 24 hr tablet Commonly known as: GLUCOPHAGE-XR Take 500 mg by mouth 2 (two) times daily.   potassium chloride  10 MEQ tablet Commonly known as: KLOR-CON  M Take 1 tablet (10 mEq total) by mouth every other day.   senna-docusate 8.6-50 MG tablet Commonly known as: Senokot-S Take 1 tablet by mouth 2 (two) times daily between meals as needed for mild constipation.   simvastatin  20 MG tablet Commonly known as: ZOCOR  Take 20 mg by mouth daily.        Contact information for follow-up providers     [name], [name], MD. Schedule an appointment as soon as possible for  a visit in 1 week(s).   Specialty: Internal Medicine Contact information: [address] [place] [address] [phone]              Contact information for after-discharge care     Home Medical Care     [facility]) .   Service: Home Health Services Contact information: [address] [place]  [address] [phone]                    Discharge Exam: [name]   03/03/24 2206 03/04/24 0151 03/05/24 0355  Weight: 72.6 kg 76.5 kg 78.3 kg    Constitutional: NAD, calm, comfortable Eyes: PERRL, lids and conjunctivae normal ENMT: Mucous membranes are moist. Posterior pharynx clear of any exudate or lesions.Normal dentition.  Neck: normal, supple, no masses, no thyromegaly Respiratory: clear to auscultation bilaterally, no wheezing, no crackles. Normal respiratory effort. No accessory muscle use.  Cardiovascular: Regular rate and rhythm, no murmurs / rubs / gallops.  She does have bilateral 2+lower extremity edema. 2+ pedal pulses. No carotid bruits.  Abdomen: no tenderness, no masses palpated. No hepatosplenomegaly. Bowel sounds positive.  Musculoskeletal: no clubbing / cyanosis. No joint deformity upper and lower extremities. Good ROM, no contractures. Normal muscle tone.  Skin: no rashes, lesions, ulcers. No induration Neurologic: CN 2-12 grossly intact. Sensation intact, DTR normal. Strength 5/5 x all 4 extremities.  Psychiatric: Normal judgment and insight. Alert and oriented x 3. Normal mood.    Condition at discharge: good  The results of significant diagnostics from this hospitalization (including imaging, microbiology, ancillary and laboratory) are listed below for reference.   Imaging Studies: ECHOCARDIOGRAM COMPLETE Result Date: 03/04/2024    ECHOCARDIOGRAM REPORT   Patient Name:   [name] Date of Exam: 03/04/2024 Medical Rec #:  [id number]       Height:       63.0 in Accession #:    [id number]      Weight:       168.7 lb Date of Birth:  1931-04-30        BSA:          1.798 m Patient Age:    89 years        BP:           133/78 mmHg Patient Gender: F               HR:           73 bpm. Exam Location:  [facility] Procedure: 2D Echo (Both Spectral and Color Flow Doppler were utilized during            procedure). Indications:    CHF I50.31  History:        Patient has no prior history of Echocardiogram examinations.                 CHF.  Sonographer:    Nathanel Devonshire Referring Phys: [id number] CAROLE N HALL IMPRESSIONS  1. Left  ventricular ejection fraction, by estimation, is 60 to 65%. The left ventricle has normal function. The left ventricle has no regional wall motion abnormalities. There is mild left ventricular hypertrophy. Left ventricular diastolic parameters were normal.  2. Right ventricular systolic function is normal. The right ventricular size is normal. There is moderately elevated pulmonary artery systolic pressure.  3. Eccentric anterior MR that is difficult to quantify. At least moderate mitral regurgitation. . The mitral valve is abnormal.  Moderate mitral valve regurgitation. No evidence of mitral stenosis. Moderate mitral annular calcification.  4. The tricuspid valve is abnormal.  5. The aortic valve was not well visualized. There is moderate calcification of the aortic valve. There is moderate thickening of the aortic valve. Aortic valve regurgitation is not visualized. No aortic stenosis is present. FINDINGS  Left Ventricle: Left ventricular ejection fraction, by estimation, is 60 to 65%. The left ventricle has normal function. The left ventricle has no regional wall motion abnormalities. The left ventricular internal cavity size was normal in size. There is  mild left ventricular hypertrophy. Left ventricular diastolic parameters were normal. Right Ventricle: The right ventricular size is normal. Right vetricular wall thickness was not well visualized. Right ventricular systolic function is normal. There is moderately elevated pulmonary artery systolic pressure. The tricuspid regurgitant velocity is 3.32 m/s, and with an assumed right atrial pressure of 3 mmHg, the estimated right ventricular systolic pressure is 47.1 mmHg. Left Atrium: Left atrial size was normal in size. Right Atrium: Right atrial size was normal in size. Pericardium: There is no evidence of pericardial effusion. Mitral Valve: Eccentric anterior MR that is difficult to quantify. At least moderate mitral regurgitation. The mitral valve is abnormal.  There is mild thickening of the mitral valve leaflet(s). There is mild calcification of the mitral valve leaflet(s). Moderate mitral annular calcification. Moderate mitral valve regurgitation. No evidence of mitral valve stenosis. Tricuspid Valve: The tricuspid valve is abnormal. Tricuspid valve regurgitation is mild . No evidence of tricuspid stenosis. Aortic Valve: The aortic valve was not well visualized. There is moderate calcification of the aortic valve. There is moderate thickening of the aortic valve. There is moderate aortic valve annular calcification. Aortic valve regurgitation is not visualized. No aortic stenosis is present. Aortic valve mean gradient measures 5.0 mmHg. Aortic valve peak gradient measures 11.7 mmHg. Aortic valve area, by VTI measures 1.85 cm. Pulmonic Valve: The pulmonic valve was not well visualized. Pulmonic valve regurgitation is not visualized. No evidence of pulmonic stenosis. Aorta: The aortic root and ascending aorta are structurally normal, with no evidence of dilitation. IAS/Shunts: The interatrial septum was not well visualized.  LEFT VENTRICLE PLAX 2D LVIDd:         4.80 cm     Diastology LVIDs:         3.20 cm     LV e' medial:    8.49 cm/s LV PW:         1.10 cm     LV E/e' medial:  13.0 LV IVS:        1.10 cm     LV e' lateral:   9.25 cm/s LVOT diam:     1.90 cm     LV E/e' lateral: 11.9 LV SV:         65 LV SV Index:   36 LVOT Area:     2.84 cm  LV Volumes (MOD) LV vol d, MOD A2C: 82.4 ml LV vol d, MOD A4C: 72.2 ml LV vol s, MOD A2C: 28.1 ml LV vol s, MOD A4C: 30.1 ml LV SV MOD A2C:     54.3 ml LV SV MOD A4C:     72.2 ml LV SV MOD BP:      48.5 ml RIGHT VENTRICLE RV Basal diam:  3.30 cm TAPSE (M-mode): 1.8 cm LEFT ATRIUM             Index LA diam:        3.40 cm 1.89 cm/m  LA Vol (A2C):   65.2 ml 36.25 ml/m LA Vol (A4C):   52.5 ml 29.19 ml/m LA Biplane Vol: 59.7 ml 33.19 ml/m  AORTIC VALVE                     PULMONIC VALVE AV Area (Vmax):    1.97 cm      PV Vmax:        1.49 m/s AV Area (Vmean):   1.61 cm      PV Peak grad:  8.9 mmHg AV Area (VTI):     1.85 cm AV Vmax:           171.00 cm/s AV Vmean:          108.000 cm/s AV VTI:            0.352 m AV Peak Grad:      11.7 mmHg AV Mean Grad:      5.0 mmHg LVOT Vmax:         119.00 cm/s LVOT Vmean:        61.400 cm/s LVOT VTI:          0.230 m LVOT/AV VTI ratio: 0.65  AORTA Ao Root diam: 3.10 cm Ao Asc diam:  2.90 cm MITRAL VALVE                  TRICUSPID VALVE MV Area (PHT): 4.57 cm       TR Peak grad:   44.1 mmHg MV Decel Time: 166 msec       TR Vmax:        332.00 cm/s MR Peak grad:    113.2 mmHg MR Mean grad:    76.0 mmHg    SHUNTS MR Vmax:         532.00 cm/s  Systemic VTI:  0.23 m MR Vmean:        414.0 cm/s   Systemic Diam: 1.90 cm MR PISA:         14.14 cm MR PISA Eff ROA: 102 mm MR PISA Radius:  1.50 cm MV E velocity: 110.00 cm/s MV A velocity: 82.80 cm/s MV E/A ratio:  1.33 Dorn Ross MD Electronically signed by Dorn Ross MD Signature Date/Time: 03/04/2024/4:20:26 PM    Final    DG Chest Portable 1 View Result Date: 03/03/2024 EXAM: 1 VIEW(S) XRAY OF THE CHEST 03/03/2024 10:31:00 PM COMPARISON: 07/05/2023 CLINICAL HISTORY: sob FINDINGS: LUNGS AND PLEURA: No focal pulmonary opacity. No pleural effusion. No pneumothorax. HEART AND MEDIASTINUM: No acute abnormality of the cardiac and mediastinal silhouettes. BONES AND SOFT TISSUES: No acute osseous abnormality. IMPRESSION: 1. No acute process. Electronically signed by: Franky Crease MD 03/03/2024 10:38 PM EST RP Workstation: [id number]    Microbiology: Results for orders placed or performed during the hospital encounter of 03/03/24  Resp panel by RT-PCR (RSV, Flu A&B, Covid) Anterior Nasal Swab     Status: None   Collection Time: 03/03/24 10:18 PM   Specimen: Anterior Nasal Swab  Result Value Ref Range Status   SARS Coronavirus 2 by RT PCR NEGATIVE NEGATIVE Final    Comment: (NOTE) SARS-CoV-2 target nucleic acids are NOT DETECTED.  The  SARS-CoV-2 RNA is generally detectable in upper respiratory specimens during the acute phase of infection. The lowest concentration of SARS-CoV-2 viral copies this assay can detect is 138 copies/mL. A negative result does not preclude SARS-Cov-2 infection and should not be used as the sole basis for treatment or other patient management decisions. A negative result  may occur with  improper specimen collection/handling, submission of specimen other than nasopharyngeal swab, presence of viral mutation(s) within the areas targeted by this assay, and inadequate number of viral copies(<138 copies/mL). A negative result must be combined with clinical observations, patient history, and epidemiological information. The expected result is Negative.  Fact Sheet for Patients:  bloggercourse.com  Fact Sheet for Healthcare Providers:  seriousbroker.it  This test is no t yet approved or cleared by the [place]  FDA and  has been authorized for detection and/or diagnosis of SARS-CoV-2 by FDA under an Emergency Use Authorization (EUA). This EUA will remain  in effect (meaning this test can be used) for the duration of the COVID-19 declaration under Section 564(b)(1) of the Act, 21 U.S.C.section 360bbb-3(b)(1), unless the authorization is terminated  or revoked sooner.       Influenza A by PCR NEGATIVE NEGATIVE Final   Influenza B by PCR NEGATIVE NEGATIVE Final    Comment: (NOTE) The Xpert Xpress SARS-CoV-2/FLU/RSV plus assay is intended as an aid in the diagnosis of influenza from Nasopharyngeal swab specimens and should not be used as a sole basis for treatment. Nasal washings and aspirates are unacceptable for Xpert Xpress SARS-CoV-2/FLU/RSV testing.  Fact Sheet for Patients: bloggercourse.com  Fact Sheet for Healthcare Providers: seriousbroker.it  This test is not yet approved or  cleared by the [place]  FDA and has been authorized for detection and/or diagnosis of SARS-CoV-2 by FDA under an Emergency Use Authorization (EUA). This EUA will remain in effect (meaning this test can be used) for the duration of the COVID-19 declaration under Section 564(b)(1) of the Act, 21 U.S.C. section 360bbb-3(b)(1), unless the authorization is terminated or revoked.     Resp Syncytial Virus by PCR NEGATIVE NEGATIVE Final    Comment: (NOTE) Fact Sheet for Patients: bloggercourse.com  Fact Sheet for Healthcare Providers: seriousbroker.it  This test is not yet approved or cleared by the [place]  FDA and has been authorized for detection and/or diagnosis of SARS-CoV-2 by FDA under an Emergency Use Authorization (EUA). This EUA will remain in effect (meaning this test can be used) for the duration of the COVID-19 declaration under Section 564(b)(1) of the Act, 21 U.S.C. section 360bbb-3(b)(1), unless the authorization is terminated or revoked.  Performed at [facility], [address] [place], [place] [address]     Labs: CBC: Recent Labs  Lab 03/03/24 2238 03/04/24 0503  WBC 6.6 6.8  NEUTROABS 4.1  --   HGB 10.3* 9.4*  HCT 32.8* 29.8*  MCV 85.2 84.4  PLT 243 226   Basic Metabolic Panel: Recent Labs  Lab 03/03/24 2238 03/04/24 0105 03/04/24 0503  NA 140  --  139  K 4.8  --  3.9  CL 101  --  102  CO2 34*  --  31  GLUCOSE 132*  --  118*  BUN 26*  --  25*  CREATININE 1.05*  --  0.99  CALCIUM 9.6  --  9.3  MG  --  2.0  --   PHOS  --  3.1  --    Liver Function Tests: Recent Labs  Lab 03/03/24 2238  AST 14*  ALT <5  ALKPHOS 100  BILITOT 0.7  PROT 6.6  ALBUMIN 4.1   CBG: No results for input(s): GLUCAP in the last 168 hours.  Discharge time spent: 40 minutes.  Signed: Deliliah Room, MD [facility] 03/05/2024 "

## 2024-03-05 NOTE — TOC Transition Note (Signed)
 Transition of Care [facility]) - Discharge Note   Patient Details  Name: [name] MRN: [id number] Date of Birth: 16-Jan-1932  Transition of Care [facility]) CM/SW Contact:  Ronnald MARLA Sil, RN Phone Number: 03/05/2024, 10:35 AM   Clinical Narrative:    Patient discussed during interdisciplinary Discharge rounds this morning with plan for patient to Discharge home Today with [facility].  CM confirmed HHPT order entered and notified agency liaison Delon via text, CM also contacted Son who confirmed he would be transporting patient home this morning.  No additional needs identified at this time, however CM team will continue to follow along and assist as appropriate.   Final next level of care: Home w Home Health Services Barriers to Discharge: No Barriers Identified   Patient Goals and CMS Choice Patient states their goals for this hospitalization and ongoing recovery are:: Return Home with HHPT Choice offered to / list presented to : Patient  Discharge Placement Name of family member notified: Son GLENWOOD Hacker Patient and family notified of of transfer: 03/05/24  Discharge Plan and Services Additional resources added to the After Visit Summary for    Post Acute Care Choice: Durable Medical Equipment          HH Arranged: PT [facility] Agency: [facility] Date [facility] Agency Contacted: 03/05/24 Time HH Agency Contacted: [id number] Representative spoke with at [facility] Agency: Delon via Text  Social Drivers of Health (SDOH) Interventions SDOH Screenings   Food Insecurity: No Food Insecurity (03/04/2024)  Housing: Low Risk (03/04/2024)  Transportation Needs: No Transportation Needs (03/04/2024)  Utilities: Not At Risk (03/04/2024)  Financial Resource Strain: Low Risk (03/12/2023)  Social Connections: Moderately Integrated (03/04/2024)  Tobacco Use: Low Risk (03/04/2024)   Readmission Risk Interventions     No data to display
